# Patient Record
Sex: Female | Born: 1970 | Race: Black or African American | Hispanic: No | Marital: Married | Smoking: Current every day smoker
Health system: Southern US, Community
[De-identification: ages and names within clinical notes are randomized; demographics above are authoritative.]

## PROBLEM LIST (undated history)

## (undated) DIAGNOSIS — G629 Polyneuropathy, unspecified: Secondary | ICD-10-CM

## (undated) DIAGNOSIS — B20 Human immunodeficiency virus [HIV] disease: Secondary | ICD-10-CM

## (undated) HISTORY — DX: Polyneuropathy, unspecified: G62.9

## (undated) HISTORY — PX: APPENDECTOMY: SHX54

---

## 2015-09-19 ENCOUNTER — Emergency Department (HOSPITAL_COMMUNITY): Payer: Medicaid Other

## 2015-09-19 ENCOUNTER — Emergency Department (HOSPITAL_COMMUNITY)
Admission: EM | Admit: 2015-09-19 | Discharge: 2015-09-19 | Disposition: A | Discharge status: Home / Self Care | Payer: Medicaid Other | Attending: Emergency Medicine | Admitting: Emergency Medicine

## 2015-09-19 ENCOUNTER — Encounter (HOSPITAL_COMMUNITY): Payer: Self-pay | Admitting: *Deleted

## 2015-09-19 DIAGNOSIS — Z720 Tobacco use: Secondary | ICD-10-CM | POA: Insufficient documentation

## 2015-09-19 DIAGNOSIS — R531 Weakness: Secondary | ICD-10-CM | POA: Diagnosis present

## 2015-09-19 DIAGNOSIS — Z8673 Personal history of transient ischemic attack (TIA), and cerebral infarction without residual deficits: Secondary | ICD-10-CM | POA: Insufficient documentation

## 2015-09-19 DIAGNOSIS — B20 Human immunodeficiency virus [HIV] disease: Secondary | ICD-10-CM | POA: Diagnosis not present

## 2015-09-19 DIAGNOSIS — H538 Other visual disturbances: Secondary | ICD-10-CM | POA: Diagnosis not present

## 2015-09-19 HISTORY — DX: Human immunodeficiency virus (HIV) disease: B20

## 2015-09-19 LAB — DIFFERENTIAL
BASOS ABS: 0 10*3/uL (ref 0.0–0.1)
BASOS PCT: 1 %
EOS ABS: 0.8 10*3/uL — AB (ref 0.0–0.7)
EOS PCT: 24 %
LYMPHS ABS: 0.8 10*3/uL (ref 0.7–4.0)
Lymphocytes Relative: 26 %
Monocytes Absolute: 0.3 10*3/uL (ref 0.1–1.0)
Monocytes Relative: 8 %
NEUTROS PCT: 41 %
Neutro Abs: 1.3 10*3/uL — ABNORMAL LOW (ref 1.7–7.7)

## 2015-09-19 LAB — URINE MICROSCOPIC-ADD ON

## 2015-09-19 LAB — I-STAT CHEM 8, ED
BUN: 11 mg/dL (ref 6–20)
CALCIUM ION: 1.12 mmol/L (ref 1.12–1.23)
Chloride: 104 mmol/L (ref 101–111)
Creatinine, Ser: 0.6 mg/dL (ref 0.44–1.00)
Glucose, Bld: 89 mg/dL (ref 65–99)
HEMATOCRIT: 32 % — AB (ref 36.0–46.0)
HEMOGLOBIN: 10.9 g/dL — AB (ref 12.0–15.0)
Potassium: 4 mmol/L (ref 3.5–5.1)
SODIUM: 139 mmol/L (ref 135–145)
TCO2: 22 mmol/L (ref 0–100)

## 2015-09-19 LAB — URINALYSIS, ROUTINE W REFLEX MICROSCOPIC
BILIRUBIN URINE: NEGATIVE
Glucose, UA: NEGATIVE mg/dL
KETONES UR: NEGATIVE mg/dL
Leukocytes, UA: NEGATIVE
NITRITE: NEGATIVE
Protein, ur: NEGATIVE mg/dL
SPECIFIC GRAVITY, URINE: 1.012 (ref 1.005–1.030)
UROBILINOGEN UA: 0.2 mg/dL (ref 0.0–1.0)
pH: 7 (ref 5.0–8.0)

## 2015-09-19 LAB — COMPREHENSIVE METABOLIC PANEL
ALT: 6 U/L — ABNORMAL LOW (ref 14–54)
AST: 13 U/L — AB (ref 15–41)
Albumin: 3.8 g/dL (ref 3.5–5.0)
Alkaline Phosphatase: 82 U/L (ref 38–126)
Anion gap: 8 (ref 5–15)
BILIRUBIN TOTAL: 0.4 mg/dL (ref 0.3–1.2)
BUN: 9 mg/dL (ref 6–20)
CHLORIDE: 104 mmol/L (ref 101–111)
CO2: 21 mmol/L — ABNORMAL LOW (ref 22–32)
Calcium: 8.5 mg/dL — ABNORMAL LOW (ref 8.9–10.3)
Creatinine, Ser: 0.61 mg/dL (ref 0.44–1.00)
GFR calc Af Amer: >60 mL/min (ref >60)
Glucose, Bld: 88 mg/dL (ref 65–99)
POTASSIUM: 3.9 mmol/L (ref 3.5–5.1)
Sodium: 133 mmol/L — ABNORMAL LOW (ref 135–145)
TOTAL PROTEIN: 8 g/dL (ref 6.5–8.1)

## 2015-09-19 LAB — I-STAT TROPONIN, ED: TROPONIN I, POC: 0 ng/mL (ref 0.00–0.08)

## 2015-09-19 LAB — APTT: APTT: 32 s (ref 24–37)

## 2015-09-19 LAB — CBC
HCT: 29.5 % — ABNORMAL LOW (ref 36.0–46.0)
HEMOGLOBIN: 9.3 g/dL — AB (ref 12.0–15.0)
MCH: 25.5 pg — AB (ref 26.0–34.0)
MCHC: 31.5 g/dL (ref 30.0–36.0)
MCV: 81 fL (ref 78.0–100.0)
Platelets: 193 10*3/uL (ref 150–400)
RBC: 3.64 MIL/uL — ABNORMAL LOW (ref 3.87–5.11)
RDW: 15.4 % (ref 11.5–15.5)
WBC: 3.1 10*3/uL — ABNORMAL LOW (ref 4.0–10.5)

## 2015-09-19 LAB — CBG MONITORING, ED: GLUCOSE-CAPILLARY: 75 mg/dL (ref 65–99)

## 2015-09-19 LAB — PROTIME-INR
INR: 1.17 (ref 0.00–1.49)
Prothrombin Time: 15.1 seconds (ref 11.6–15.2)

## 2015-09-19 NOTE — ED Notes (Signed)
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  Pt states CD4 Count low.  Pt states got dizzy yesterday and both legs gave out.  Pt denies cough, diarrhea, vomiting.  Pt is alert and reports intermittent left eye vision loss.  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

## 2015-09-19 NOTE — ED Notes (Signed)
Pt and family at bedside given happy meals and sprite to drink. nad noted.

## 2015-09-19 NOTE — ED Notes (Signed)
Pt reports "I feel like I have something crawling on me and biting me and im itchy." no bugs seen on pt and no bite marks noted to skin. Pt requesting rx cream to apply to arms and legs, PA Pickering notified and made aware. PA recommends hydrocortisone cream, pt informed of this. Pt upset at this time and requesting to speak with PA, states "Ive tried hydrocortisone cream and it doesn't work."

## 2015-09-19 NOTE — Discharge Instructions (Signed)
AIDS AIDS (Acquired Immune Deficiency Syndrome) is a severe viral infection caused by the Human Immunodeficiency Virus (HIV). This virus destroys a person's resistance to disease and certain cancers. It is transmitted through blood, blood products, and body fluids. Although the fear of AIDS has grown faster than the epidemic, it is important to note that AIDS is not spread by casual contact and is easily killed by hot water, soap, bleach, and most antiseptics. HOW THE AIDS INFECTION WORKS Once HIV enters the body it affects T-helper (T-4) lymphocytes. These are white blood cells that are crucial for the immune system. Once they become infected, they become factories for producing the AIDS Virus. Eventually these infected T-4 cells die. This leaves the victim susceptible to infection and certain cancers. While anyone can get AIDS, you are unlikely to get this disease unless you indulge in high-risk behavior. Some of these high-risk behaviors are promiscuous sex, having close relationships with HIV-positive people, and the sharing of needles. This illness was initially more common in homosexual men, but as time progresses, it will most probably affect an equal number of men and women. Babies born to women who are infected, have greater chances of getting AIDS. Infection with HIV may cause a brief, mild illness with fever several weeks after contact. More serious symptoms do not develop until months or years later, so a person can be infected with the virus without showing any symptoms at all. At this stage of the infection there is no way of knowing you are infected unless you have a blood or mouth scraping test for the AIDS virus. SYMPTOMS   Fevers, night sweats, general weakness, enlarged lymph nodes.  Chest pain, pneumonia, chronic cough, shortness of breath.  Weight loss, diarrhea, difficulty swallowing, rectal problems.  Headaches, personality changes, problems with vision and memory.  Skin tumors  (patchy dark areas) and infections. There is no cure or vaccine for AIDS at the present time. Anti-viral antibiotic drugs have been shown to stop the virus from multiplying, which helps prolong health. The best treatment against AIDS is prevention. If you have been infected with HIV, careful medical follow-up with regular blood tests is necessary.  Do not take part in risky behaviors. These include sharing needles and syringes or other sharp instruments like razors with others, having unprotected sex with high-risk people (homosexuals, bisexuals, or prostitutes), and engaging in anal sex (with or without a condom).  For further information about AIDS, please call your caregiver, the health department, or the Center for Disease Control: 800-342-AIDS. MISCONCEPTIONS ABOUT AIDS  You can not get AIDS through casual contact. This includes sitting next to an AIDS infected person, being coughed on, living with, swimming with, eating food prepared by, sitting or lying next to someone with AIDS.  It is not caught from toilet seats, from showers, bath tubs, water fountains, phones, drinking glasses, or food touched or used by people with AIDS. Casual kissing will probably not transmit the disease. "[place] kissing" (putting one's tongue in another's mouth) is probably not a good idea as the AIDS Virus is present in saliva.  You will not get AIDS by donating blood. The needles used by blood banks are sterile and disposable.  There is no evidence that AIDS is transmitted through tears.  AIDS is not caught through mosquitoes.  Children with AIDS will not pass AIDS to other children in school without exchange of blood products or engaging in sex. In school, a child with AIDS is actually at greater risk because  of their weak immune status. Their susceptibility to the viruses and germs (bacteria) carried by children without AIDS is great. TESTING FOR AIDS  An ELISA (enzyme-linked immunoabsorbent assay) is a blood  test available to let you know if you have contracted AIDS. If this test is positive, it is usually repeated.  If positive a second time, a second test known as the Western blot test is performed. If the Western blot test is positive, it means you have been infected with HIV. PREVENTION  The best way to prevent AIDS is to avoid high-risk behavior.  The outlook for defeating AIDS is good. Millions of research dollars are being spent on creating a vaccine to prevent the disease as well as providing a cure. New drugs appear to be extremely effective at controlling the disease.  Your caregiver will educate you in all the most effective and current treatments. Document Released: 11/29/2000 Document Revised: 02/24/2012 Document Reviewed: 11/25/[address] [facility] Patient Information 2015 [place], [place]. This information is not intended to replace advice given to you by your health care provider. Make sure you discuss any questions you have with your health care provider.

## 2015-09-19 NOTE — ED Notes (Signed)
Patient transported to CT 

## 2015-09-19 NOTE — ED Provider Notes (Signed)
CSN: [id number]     Arrival date & time 09/19/15  1328 History   First MD Initiated Contact with Patient 09/19/15 1508     No chief complaint on file.    (Consider location/radiation/quality/duration/timing/severity/associated sxs/prior Treatment) HPI Comments: Pt comes in with c/o bilateral leg weakness for the last couple of days. Pt states that she was standing and she fell. She denies one sided weakness. She states that she had some right sided weakness from a previous stroke.She states that she has been having vision changes to the left eye for the last couple of months and she was referred to opthalmology but because of moving she never follow up. She was told that her blood vessels were black in her eye. She states that she didn't have a loc. She has not taken her AIDS medications in 4 months and she wants to get set up with ID here. No fevers.   The history is provided by the patient. No language interpreter was used.    Past Medical History  Diagnosis Date  . AIDS (acquired immune deficiency syndrome) [facility])    Past Surgical History  Procedure Laterality Date  . Appendectomy     No family history on file. Social History  Substance Use Topics  . Smoking status: Current Every Day Smoker  . Smokeless tobacco: None  . Alcohol Use: No   OB History    No data available     Review of Systems  All other systems reviewed and are negative.     Allergies  Review of patient's allergies indicates no known allergies.  Home Medications   Prior to Admission medications   Not on File   BP 133/89 mmHg  Pulse 65  Temp(Src) 98.2 F (36.8 C) (Oral)  Resp 18  SpO2 100%  LMP 09/18/2015 Physical Exam  Constitutional: She is oriented to person, place, and time. She appears well-developed and well-nourished.  HENT:  Head: Normocephalic and atraumatic.  Cardiovascular: Normal rate and regular rhythm.   Pulmonary/Chest: Effort normal and breath sounds normal.  Abdominal: Soft.  Bowel sounds are normal.  Neurological: She is alert and oriented to person, place, and time. She exhibits normal muscle tone. Coordination normal.  No noted weakness. No pronator drip  Skin: Skin is warm and dry.  Psychiatric: She has a normal mood and affect.  Nursing note and vitals reviewed.   ED Course  Procedures (including critical care time) Labs Review Labs Reviewed  CBC - Abnormal; Notable for the following:    WBC 3.1 (*)    RBC 3.64 (*)    Hemoglobin 9.3 (*)    HCT 29.5 (*)    MCH 25.5 (*)    All other components within normal limits  DIFFERENTIAL - Abnormal; Notable for the following:    Neutro Abs 1.3 (*)    Eosinophils Absolute 0.8 (*)    All other components within normal limits  COMPREHENSIVE METABOLIC PANEL - Abnormal; Notable for the following:    Sodium 133 (*)    CO2 21 (*)    Calcium 8.5 (*)    AST 13 (*)    ALT 6 (*)    All other components within normal limits  URINALYSIS, ROUTINE W REFLEX MICROSCOPIC (NOT AT [facility]) - Abnormal; Notable for the following:    Hgb urine dipstick LARGE (*)    All other components within normal limits  I-STAT CHEM 8, ED - Abnormal; Notable for the following:    Hemoglobin 10.9 (*)  HCT 32.0 (*)    All other components within normal limits  PROTIME-INR  APTT  URINE MICROSCOPIC-ADD ON  I-STAT TROPOININ, ED  CBG MONITORING, ED    Imaging Review Ct Head Wo Contrast  09/19/2015   CLINICAL DATA:  Left-sided headache. Left-sided vision loss. HIV disease. History of prior infarct.  EXAM: CT HEAD WITHOUT CONTRAST  TECHNIQUE: Contiguous axial images were obtained from the base of the skull through the vertex without intravenous contrast.  COMPARISON:  None.  FINDINGS: The ventricles are normal in size and configuration. There is no intracranial mass, hemorrhage, extra-axial fluid collection, or midline shift. There is evidence of a prior infarct in the posterior mid left frontal lobe. There is evidence of age uncertain but  probably nonacute infarction in the anterior to mid left centrum semiovale. There is a small focus of decreased attenuation in the posterior superior left cerebellum seen on axial slice 10 series 201, consistent with a small age uncertain infarct. Elsewhere the gray-white compartments appear normal. The bony calvarium appears intact. The mastoid air cells are clear. There is rightward deviation of the nasal septum. No intraorbital lesions are demonstrable.  IMPRESSION: Age uncertain but probably nonacute appearing infarcts in the anterior to mid left centrum semiovale as well as in the posterior mid left frontal lobe region. Age uncertain small infarct posterior aspect of the superior left frontal lobe seen on axial slice 10 series 601. No clearly acute infarct is evident. No hemorrhage or mass effect.  If there remains concern for potential acute infarct, brain MR could be helpful to further assess in this regard.   Electronically Signed   By: Bretta Bang III M.D.   On: 09/19/2015 15:58   I have personally reviewed and evaluated these images and lab results as part of my medical decision-making.   EKG Interpretation None      MDM   Final diagnoses:  AIDS (acquired immunodeficiency syndrome) (HCC)    Spoke with Dr. Drue Second with ID and pt is to get a call from the clinic to set up care. Don't think ct image is acute based on pt presentation. Pt in agreement with plan.  Teressa Lower, NP 09/19/15 1710  Mirian Mo, MD 09/21/15 1531

## 2015-09-27 ENCOUNTER — Emergency Department (HOSPITAL_COMMUNITY): Payer: Medicaid Other

## 2015-09-27 ENCOUNTER — Encounter (HOSPITAL_COMMUNITY): Payer: Self-pay | Admitting: Emergency Medicine

## 2015-09-27 ENCOUNTER — Emergency Department (HOSPITAL_COMMUNITY)
Admission: EM | Admit: 2015-09-27 | Discharge: 2015-09-27 | Disposition: A | Discharge status: Home / Self Care | Payer: Medicaid Other | Attending: Emergency Medicine | Admitting: Emergency Medicine

## 2015-09-27 DIAGNOSIS — Z720 Tobacco use: Secondary | ICD-10-CM | POA: Insufficient documentation

## 2015-09-27 DIAGNOSIS — R531 Weakness: Secondary | ICD-10-CM | POA: Insufficient documentation

## 2015-09-27 DIAGNOSIS — Z8673 Personal history of transient ischemic attack (TIA), and cerebral infarction without residual deficits: Secondary | ICD-10-CM | POA: Diagnosis not present

## 2015-09-27 DIAGNOSIS — Z7951 Long term (current) use of inhaled steroids: Secondary | ICD-10-CM | POA: Diagnosis not present

## 2015-09-27 DIAGNOSIS — B20 Human immunodeficiency virus [HIV] disease: Secondary | ICD-10-CM | POA: Diagnosis not present

## 2015-09-27 LAB — COMPREHENSIVE METABOLIC PANEL
ALT: 7 U/L — ABNORMAL LOW (ref 14–54)
ANION GAP: 7 (ref 5–15)
AST: 14 U/L — ABNORMAL LOW (ref 15–41)
Albumin: 3.4 g/dL — ABNORMAL LOW (ref 3.5–5.0)
Alkaline Phosphatase: 76 U/L (ref 38–126)
BILIRUBIN TOTAL: 0.5 mg/dL (ref 0.3–1.2)
BUN: 9 mg/dL (ref 6–20)
CALCIUM: 9 mg/dL (ref 8.9–10.3)
CO2: 26 mmol/L (ref 22–32)
Chloride: 104 mmol/L (ref 101–111)
Creatinine, Ser: 0.7 mg/dL (ref 0.44–1.00)
GLUCOSE: 84 mg/dL (ref 65–99)
POTASSIUM: 3.6 mmol/L (ref 3.5–5.1)
Sodium: 137 mmol/L (ref 135–145)
TOTAL PROTEIN: 8.8 g/dL — AB (ref 6.5–8.1)

## 2015-09-27 LAB — CBC WITH DIFFERENTIAL/PLATELET
Basophils Absolute: 0 10*3/uL (ref 0.0–0.1)
Basophils Relative: 1 %
Eosinophils Absolute: 0.5 10*3/uL (ref 0.0–0.7)
Eosinophils Relative: 18 %
HEMATOCRIT: 29.1 % — AB (ref 36.0–46.0)
Hemoglobin: 9.5 g/dL — ABNORMAL LOW (ref 12.0–15.0)
LYMPHS PCT: 28 %
Lymphs Abs: 0.9 10*3/uL (ref 0.7–4.0)
MCH: 26.2 pg (ref 26.0–34.0)
MCHC: 32.6 g/dL (ref 30.0–36.0)
MCV: 80.4 fL (ref 78.0–100.0)
MONO ABS: 0.4 10*3/uL (ref 0.1–1.0)
MONOS PCT: 14 %
NEUTROS ABS: 1.2 10*3/uL — AB (ref 1.7–7.7)
Neutrophils Relative %: 39 %
Platelets: 185 10*3/uL (ref 150–400)
RBC: 3.62 MIL/uL — ABNORMAL LOW (ref 3.87–5.11)
RDW: 15 % (ref 11.5–15.5)
WBC: 3 10*3/uL — ABNORMAL LOW (ref 4.0–10.5)

## 2015-09-27 MED ORDER — SODIUM CHLORIDE 0.9 % IV SOLN
INTRAVENOUS | Status: DC
Start: 2015-09-27 — End: 2015-09-28
  Administered 2015-09-27: 16:00:00 via INTRAVENOUS

## 2015-09-27 MED ORDER — MORPHINE SULFATE (PF) 4 MG/ML IV SOLN
4.0000 mg | Freq: Once | INTRAVENOUS | Status: AC
  Administered 2015-09-27: 4 mg via INTRAVENOUS
  Filled 2015-09-27: qty 1

## 2015-09-27 MED ORDER — GADOBENATE DIMEGLUMINE 529 MG/ML IV SOLN
12.0000 mL | Freq: Once | INTRAVENOUS | Status: AC | PRN
  Administered 2015-09-27: 12 mL via INTRAVENOUS

## 2015-09-27 MED ORDER — METHOCARBAMOL 750 MG PO TABS: 750.0000 mg | ORAL_TABLET | Freq: Four times a day (QID) | ORAL | Status: DC

## 2015-09-27 MED ORDER — OXYCODONE-ACETAMINOPHEN 5-325 MG PO TABS: 1.0000 | ORAL_TABLET | ORAL | Status: DC | PRN

## 2015-09-27 NOTE — ED Provider Notes (Signed)
CSN: [id number]     Arrival date & time 09/27/15  1220 History   First MD Initiated Contact with Patient 09/27/15 1528     Chief Complaint  Patient presents with  . Extremity Weakness  . Back Pain     (Consider location/radiation/quality/duration/timing/severity/associated sxs/prior Treatment) HPI Comments: Patient here complaining of worsening bilateral lower extremity weakness 1 month. Patient able to ambulate but with some difficulty. Seen 1 week ago for similar symptoms and had a head CT which showed strokes of indeterminate age. Denies any bowel or bladder dysfunction. States that she also has pain that starts in her lateral lower extremities and radiates to her back. Denies any urinary symptoms. States that her legs sometimes give out on her and she has fall to the ground. Denies any significant head trauma. She has a known diagnosis of HIV and according to her has full-blown AIDS but is not taking any medications. Denies any associated fevers or shortness of breath.  Patient is a 44 y.o. female presenting with extremity weakness and back pain. The history is provided by the patient.  Extremity Weakness  Back Pain   Past Medical History  Diagnosis Date  . AIDS (acquired immune deficiency syndrome) [facility])    Past Surgical History  Procedure Laterality Date  . Appendectomy     No family history on file. Social History  Substance Use Topics  . Smoking status: Current Every Day Smoker  . Smokeless tobacco: None  . Alcohol Use: No   OB History    No data available     Review of Systems  Musculoskeletal: Positive for back pain and extremity weakness.  All other systems reviewed and are negative.     Allergies  Review of patient's allergies indicates no known allergies.  Home Medications   Prior to Admission medications   Medication Sig Start Date End Date Taking? Authorizing Provider  albuterol (PROVENTIL HFA;VENTOLIN HFA) 108 (90 BASE) MCG/ACT inhaler Inhale 1  puff into the lungs every 6 (six) hours as needed for wheezing or shortness of breath.    Historical Provider, MD  beclomethasone (QVAR) 40 MCG/ACT inhaler Inhale 1 puff into the lungs 2 (two) times daily.    Historical Provider, MD   BP 120/77 mmHg  Pulse 74  Temp(Src) 97.9 F (36.6 C) (Oral)  Resp 16  Ht  (1.676 m)  Wt 130 lb (58.968 kg)  BMI 20.99 kg/m2  SpO2 100%  LMP 09/18/2015 Physical Exam  Constitutional: She is oriented to person, place, and time. She appears well-developed and well-nourished.  Non-toxic appearance. No distress.  HENT:  Head: Normocephalic and atraumatic.  Eyes: Conjunctivae, EOM and lids are normal. Pupils are equal, round, and reactive to light.  Neck: Normal range of motion. Neck supple. No tracheal deviation present. No thyroid mass present.  Cardiovascular: Normal rate, regular rhythm and normal heart sounds.  Exam reveals no gallop.   No murmur heard. Pulmonary/Chest: Effort normal and breath sounds normal. No stridor. No respiratory distress. She has no decreased breath sounds. She has no wheezes. She has no rhonchi. She has no rales.  Abdominal: Soft. Normal appearance and bowel sounds are normal. She exhibits no distension. There is no tenderness. There is no rebound and no CVA tenderness.  Musculoskeletal: Normal range of motion. She exhibits no edema or tenderness.  Neurological: She is alert and oriented to person, place, and time. She has normal strength. She displays no atrophy. No cranial nerve deficit or sensory deficit. She exhibits normal muscle  tone. GCS eye subscore is 4. GCS verbal subscore is 5. GCS motor subscore is 6.  Reflex Scores:      Patellar reflexes are 3+ on the right side and 3+ on the left side. Skin: Skin is warm and dry. No abrasion and no rash noted.  Psychiatric: She has a normal mood and affect. Her speech is normal and behavior is normal.  Nursing note and vitals reviewed.   ED Course  Procedures (including  critical care time) Labs Review Labs Reviewed  CBC WITH DIFFERENTIAL/PLATELET  COMPREHENSIVE METABOLIC PANEL    Imaging Review No results found. I have personally reviewed and evaluated these images and lab results as part of my medical decision-making.   EKG Interpretation None      MDM   Final diagnoses:  Weakness    Patient given pain meds and feels better here. Her MRI of her brain as well as lumbar spine were negative for acute findings. Neurological exam she has no gross focal deficits. She is encouraged to follow-up in ID clinic. 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

## 2015-09-27 NOTE — ED Notes (Signed)
Onset 5-6 days ago lower back pain radiating to bilateral lower extremities with tingling numbness pain. Currently pain 10/10 sharp.

## 2015-09-27 NOTE — Discharge Instructions (Signed)
Weakness Weakness is a lack of strength. It may be felt all over the body (generalized) or in one specific part of the body (focal). Some causes of weakness can be serious. You may need further medical evaluation, especially if you are elderly or you have a history of immunosuppression (such as chemotherapy or HIV), kidney disease, heart disease, or diabetes. CAUSES  Weakness can be caused by many different things, including:  Infection.  Physical exhaustion.  Internal bleeding or other blood loss that results in a lack of red blood cells (anemia).  Dehydration. This cause is more common in elderly people.  Side effects or electrolyte abnormalities from medicines, such as pain medicines or sedatives.  Emotional distress, anxiety, or depression.  Circulation problems, especially severe peripheral arterial disease.  Heart disease, such as rapid atrial fibrillation, bradycardia, or heart failure.  Nervous system disorders, such as Guillain-Barr syndrome, multiple sclerosis, or stroke. DIAGNOSIS  To find the cause of your weakness, your caregiver will take your history and perform a physical exam. Lab tests or X-rays may also be ordered, if needed. TREATMENT  Treatment of weakness depends on the cause of your symptoms and can vary greatly. HOME CARE INSTRUCTIONS   Rest as needed.  Eat a well-balanced diet.  Try to get some exercise every day.  Only take over-the-counter or prescription medicines as directed by your caregiver. SEEK MEDICAL CARE IF:   Your weakness seems to be getting worse or spreads to other parts of your body.  You develop new aches or pains. SEEK IMMEDIATE MEDICAL CARE IF:   You cannot perform your normal daily activities, such as getting dressed and feeding yourself.  You cannot walk up and down stairs, or you feel exhausted when you do so.  You have shortness of breath or chest pain.  You have difficulty moving parts of your body.  You have weakness  in only one area of the body or on only one side of the body.  You have a fever.  You have trouble speaking or swallowing.  You cannot control your bladder or bowel movements.  You have black or bloody vomit or stools. MAKE SURE YOU:  Understand these instructions.  Will watch your condition.  Will get help right away if you are not doing well or get worse.   This information is not intended to replace advice given to you by your health care provider. Make sure you discuss any questions you have with your health care provider.   Document Released: 12/02/2005 Document Revised: 06/02/2012 Document Reviewed: 01/31/2012 Elsevier Interactive Patient Education 2016 [employer] Neuropathic Pain Neuropathic pain is pain caused by damage to the nerves that are responsible for certain sensations in your body (sensory nerves). The pain can be caused by damage to:   The sensory nerves that send signals to your spinal cord and brain (peripheral nervous system).  The sensory nerves in your brain or spinal cord (central nervous system). Neuropathic pain can make you more sensitive to pain. What would be a minor sensation for most people may feel very painful if you have neuropathic pain. This is usually a long-term condition that can be difficult to treat. The type of pain can differ from person to person. It may start suddenly (acute), or it may develop slowly and last for a long time (chronic). Neuropathic pain may come and go as damaged nerves heal or may stay at the same level for years. It often causes emotional distress, loss of sleep, and a  lower quality of life. CAUSES  The most common cause of damage to a sensory nerve is diabetes. Many other diseases and conditions can also cause neuropathic pain. Causes of neuropathic pain can be classified as:  Toxic. Many drugs and chemicals can cause toxic damage. The most common cause of toxic neuropathic pain is damage from drug treatment for  cancer (chemotherapy).  Metabolic. This type of pain can happen when a disease causes imbalances that damage nerves. Diabetes is the most common of these diseases. Vitamin B deficiency caused by long-term alcohol abuse is another common cause.  Traumatic. Any injury that cuts, crushes, or stretches a nerve can cause damage and pain. A common example is feeling pain after losing an arm or leg (phantom limb pain).  Compression-related. If a sensory nerve gets trapped or compressed for a long period of time, the blood supply to the nerve can be cut off.  Vascular. Many blood vessel diseases can cause neuropathic pain by decreasing blood supply and oxygen to nerves.  Autoimmune. This type of pain results from diseases in which the body's defense system mistakenly attacks sensory nerves. Examples of autoimmune diseases that can cause neuropathic pain include lupus and multiple sclerosis.  Infectious. Many types of viral infections can damage sensory nerves and cause pain. Shingles infection is a common cause of this type of pain.  Inherited. Neuropathic pain can be a symptom of many diseases that are passed down through families (genetic). SIGNS AND SYMPTOMS  The main symptom is pain. Neuropathic pain is often described as:  Burning.  Shock-like.  Stinging.  Hot or cold.  Itching. DIAGNOSIS  No single test can diagnose neuropathic pain. Your health care provider will do a physical exam and ask you about your pain. You may use a pain scale to describe how bad your pain is. You may also have tests to see if you have a high sensitivity to pain and to help find the cause and location of any sensory nerve damage. These tests may include:  Imaging studies, such as:  X-rays.  CT scan.  MRI.  Nerve conduction studies to test how well nerve signals travel through your sensory nerves (electrodiagnostic testing).  Stimulating your sensory nerves through electrodes on your skin and measuring  the response in your spinal cord and brain (somatosensory evoked potentials). TREATMENT  Treatment for neuropathic pain may change over time. You may need to try different treatment options or a combination of treatments. Some options include:  Over-the-counter pain relievers.  Prescription medicines. Some medicines used to treat other conditions may also help neuropathic pain. These include medicines to:  Control seizures (anticonvulsants).  Relieve depression (antidepressants).  Prescription-strength pain relievers (narcotics). These are usually used when other pain relievers do not help.  Transcutaneous nerve stimulation (TENS). This uses electrical currents to block painful nerve signals. The treatment is painless.  Topical and local anesthetics. These are medicines that numb the nerves. They can be injected as a nerve block or applied to the skin.  Alternative treatments, such as:  Acupuncture.  Meditation.  Massage.  Physical therapy.  Pain management programs.  Counseling. HOME CARE INSTRUCTIONS  Learn as much as you can about your condition.  Take medicines only as directed by your health care provider.  Work closely with all your health care providers to find what works best for you.  Have a good support system at home.  Consider joining a chronic pain support group. SEEK MEDICAL CARE IF:  Your pain treatments are  not helping.  You are having side effects from your medicines.  You are struggling with fatigue, mood changes, depression, or anxiety.   This information is not intended to replace advice given to you by your health care provider. Make sure you discuss any questions you have with your health care provider.   Document Released: 08/29/2004 Document Revised: 12/23/2014 Document Reviewed: 05/12/2014 Elsevier Interactive Patient Education [employer]

## 2015-09-27 NOTE — ED Notes (Signed)
Pt out of room to MRI at this time.

## 2015-10-06 ENCOUNTER — Encounter (HOSPITAL_BASED_OUTPATIENT_CLINIC_OR_DEPARTMENT_OTHER): Payer: Medicaid Other | Admitting: Clinical

## 2015-10-06 ENCOUNTER — Telehealth: Payer: Self-pay | Admitting: Physician Assistant

## 2015-10-06 ENCOUNTER — Other Ambulatory Visit: Payer: Self-pay | Admitting: Physician Assistant

## 2015-10-06 ENCOUNTER — Ambulatory Visit: Payer: Medicaid Other | Attending: Physician Assistant | Admitting: Physician Assistant

## 2015-10-06 VITALS — BP 114/79 | HR 90 | Temp 98.2°F | Resp 18 | Ht 65.0 in | Wt 129.4 lb

## 2015-10-06 DIAGNOSIS — Z658 Other specified problems related to psychosocial circumstances: Secondary | ICD-10-CM

## 2015-10-06 DIAGNOSIS — M549 Dorsalgia, unspecified: Secondary | ICD-10-CM | POA: Diagnosis present

## 2015-10-06 DIAGNOSIS — Z79899 Other long term (current) drug therapy: Secondary | ICD-10-CM | POA: Insufficient documentation

## 2015-10-06 DIAGNOSIS — Z720 Tobacco use: Secondary | ICD-10-CM | POA: Insufficient documentation

## 2015-10-06 DIAGNOSIS — B20 Human immunodeficiency virus [HIV] disease: Secondary | ICD-10-CM | POA: Insufficient documentation

## 2015-10-06 DIAGNOSIS — G629 Polyneuropathy, unspecified: Secondary | ICD-10-CM | POA: Insufficient documentation

## 2015-10-06 DIAGNOSIS — G909 Disorder of the autonomic nervous system, unspecified: Secondary | ICD-10-CM | POA: Diagnosis not present

## 2015-10-06 DIAGNOSIS — F17200 Nicotine dependence, unspecified, uncomplicated: Secondary | ICD-10-CM | POA: Diagnosis not present

## 2015-10-06 LAB — VITAMIN B12: Vitamin B-12: 445 pg/mL (ref 211–911)

## 2015-10-06 LAB — TSH: TSH: 0.573 u[IU]/mL (ref 0.350–4.500)

## 2015-10-06 MED ORDER — ELVITEG-COBIC-EMTRICIT-TENOFDF 150-150-200-300 MG PO TABS: 1.0000 | ORAL_TABLET | Freq: Once | ORAL | Status: DC

## 2015-10-06 MED ORDER — SILDENAFIL CITRATE 20 MG PO TABS: 20.0000 mg | ORAL_TABLET | Freq: Three times a day (TID) | ORAL | Status: AC

## 2015-10-06 MED ORDER — PERMETHRIN 1 % EX LOTN: 1.0000 "application " | TOPICAL_LOTION | Freq: Once | CUTANEOUS | Status: DC

## 2015-10-06 MED ORDER — METHOCARBAMOL 750 MG PO TABS: 750.0000 mg | ORAL_TABLET | Freq: Four times a day (QID) | ORAL | Status: AC

## 2015-10-06 MED ORDER — BECLOMETHASONE DIPROPIONATE 40 MCG/ACT IN AERS: 1.0000 | INHALATION_SPRAY | Freq: Two times a day (BID) | RESPIRATORY_TRACT | Status: AC

## 2015-10-06 MED ORDER — ALBUTEROL SULFATE HFA 108 (90 BASE) MCG/ACT IN AERS: 1.0000 | INHALATION_SPRAY | Freq: Four times a day (QID) | RESPIRATORY_TRACT | Status: AC | PRN

## 2015-10-06 MED ORDER — OMEPRAZOLE 40 MG PO CPDR: 40.0000 mg | DELAYED_RELEASE_CAPSULE | Freq: Every day | ORAL | Status: DC

## 2015-10-06 MED ORDER — QUETIAPINE FUMARATE 50 MG PO TABS: 50.0000 mg | ORAL_TABLET | Freq: Every morning | ORAL | Status: AC

## 2015-10-06 MED ORDER — ALBUTEROL SULFATE HFA 108 (90 BASE) MCG/ACT IN AERS: 1.0000 | INHALATION_SPRAY | Freq: Four times a day (QID) | RESPIRATORY_TRACT | Status: DC | PRN

## 2015-10-06 MED ORDER — QUETIAPINE FUMARATE 100 MG PO TABS: 100.0000 mg | ORAL_TABLET | Freq: Every day | ORAL | Status: AC

## 2015-10-06 MED ORDER — PERMETHRIN 5 % EX CREA: 1.0000 "application " | TOPICAL_CREAM | Freq: Once | CUTANEOUS | Status: DC

## 2015-10-06 NOTE — Progress Notes (Addendum)
Meagan Mcclure  ZOX:[id number]  [id number]:[id number]  DOB - [birth year]  Chief Complaint  Patient presents with  . Hospitalization Follow-up  . Back Pain       Subjective:   Meagan Mcclure is a 44 y.o. female here today for tablet care. She was in the emergency department on October 12th with a chief complaint of back pain and lower extremity pain. Her imaging studies were negative. Her labs were only significant for anemia. She was discharged on Robaxin and Percocet. She was also instructed that she needed to establish care with an infectious disease specialist and a primary care provider.  She's been taking the medications. The Robaxin seems to help a little. The Percocet has not touched her pain. She describes pain that starts in her feet and moves up to her body. She has a central back discomfort that spreads outwards. She has difficulty with range of motion. She is able to walk a little. She's very uncomfortable week.  Further history reveals that she is only been in this area approximately 6 weeks. [place] She was diagnosed with AIDS sometime ago and has been off her antiretrovirals since at least April. She now has Medicaid.   ROS: GEN: denies fever or chills, denies change in weight Skin: denies lesions or rashes HEENT: denies headache, earache, epistaxis, sore throat, or neck pain EXT: + muscle spasms or swelling; no pain in lower ext, no weakness NEURO: + numbness or tingling, denies sz, stroke or TIA  Problem  Aids (Hcc)  HIV-1 Associated Autonomic Neuropathy (Hcc)  Tobacco Use Disorder    ALLERGIES: No Known Allergies  PAST MEDICAL HISTORY: Past Medical History  Diagnosis Date  . AIDS (acquired immune deficiency syndrome) (HCC)     PAST SURGICAL HISTORY: Past Surgical History  Procedure Laterality Date  . Appendectomy      MEDICATIONS AT HOME: Prior to Admission medications   Medication Sig Start Date End  Date Taking? Authorizing Provider  albuterol (PROVENTIL HFA;VENTOLIN HFA) 108 (90 BASE) MCG/ACT inhaler Inhale 1 puff into the lungs every 6 (six) hours as needed for wheezing or shortness of breath.   Yes Historical Provider, MD  beclomethasone (QVAR) 40 MCG/ACT inhaler Inhale 1 puff into the lungs 2 (two) times daily.   Yes Historical Provider, MD  elvitegravir-cobicistat-emtricitabine-tenofovir (STRIBILD) 150-150-200-300 MG TABS tablet Take 1 tablet by mouth once.   Yes Historical Provider, MD  methocarbamol (ROBAXIN-750) 750 MG tablet Take 1 tablet (750 mg total) by mouth 4 (four) times daily. 09/27/15  Yes Lorre NickAnthony Allen, MD  omeprazole (PRILOSEC) 40 MG capsule Take 40 mg by mouth daily.   Yes Historical Provider, MD  QUEtiapine (SEROQUEL) 100 MG tablet Take 100 mg by mouth at bedtime.   Yes Historical Provider, MD  QUEtiapine (SEROQUEL) 50 MG tablet Take 50 mg by mouth every morning.   Yes Historical Provider, MD  sildenafil (REVATIO) 20 MG tablet Take 20 mg by mouth 3 (three) times daily.   Yes Historical Provider, MD  oxyCODONE-acetaminophen (PERCOCET/ROXICET) 5-325 MG tablet Take 1-2 tablets by mouth every 4 (four) hours as needed for severe pain. Patient not taking: Reported on 10/06/2015 09/27/15   Lorre NickAnthony Allen, MD     Objective:   Filed Vitals:   10/06/15 0914  BP: 114/79  Pulse: 90  Temp: 98.2 F (36.8 C)  TempSrc: Oral  Resp: 18  Height: 5\' 5"  (1.651 m)  Weight: 129 lb 6.4 oz (58.695 kg)  SpO2: 99%  Exam General appearance : Awake, alert, not in any distress. Speech Clear. Not toxic looking HEENT: Atraumatic and Normocephalic, pupils equally reactive to light and accomodation Abdomen: Bowel sounds present, Non tender and not distended with no gaurding, rigidity or rebound. Extremities: B/L Lower Ext shows no edema, both legs are warm to touch good pulses and sensation; reflexes  Neurology: Awake alert, and oriented X 3, CN II-XII intact, Non focal; decreased  ROM   Assessment & Plan  1. HIV related Neuropathy  -check B12, TSH  -Discussed Gabapentin, Lyrica etc but pt has tried without success in the past  -Cont NSAIDS, muscle relaxers, Percocet for now  2. AIDS  -working on obtaining ART/ discussed with [facility] and SW   -referral to [facility] (will let them order viral load, CD4 etc)  -Attempt to get records from [place]  3. Tobacco Abuse  -smoking cessation  44 minutes was spent with her, the pharmacists, SW etc trying to coordinate medications and utilize other resources.   Return in about 2 weeks (around 10/20/2015) for with Dr. Armen Pickup to establish care.  The patient was given clear instructions to go to ER or return to medical center if symptoms don't improve, worsen or new problems develop. The patient verbalized understanding. The patient was told to call to get lab results if they haven't heard anything in the next week.   This note has been created with Education officer, environmental. Any transcriptional errors are unintentional.    Scot Jun, PA-C [facility] [place], [place] [phone]   10/06/2015, 10:00 AM

## 2015-10-06 NOTE — Telephone Encounter (Signed)
Pt. Called stating that the prescription of permethrin (PERMETHRIN LICE TREATMENT) 1 % lotion  Is not available at [facility] they only [facility] the cream and [facility] needs [facility] cream. Pt. Is at the pharmacy.Please f/u

## 2015-10-06 NOTE — Progress Notes (Signed)
Patient having pain and weakness in lower extremities  x3 weeks.  Patient indicates weakness radiating from legs into spine. Pain currently 7/10. Prescribed Percocet but not working per patient.

## 2015-10-06 NOTE — Addendum Note (Signed)
Addended byVivianne Master: NOEL, TIFFANY S on: 10/06/2015 10:32 AM   Modules accepted: Orders

## 2015-10-06 NOTE — Patient Instructions (Signed)
Stop Smoking!  [facility] [id number]  [phone]  Establish with a doctor here in 2 weeks. Schedule an appt today. (Dr. Armen PickupFunches)

## 2015-10-06 NOTE — Progress Notes (Signed)
ASSESSMENT: Pt currently experiencing psychosocial stressors, needs to establish PCP care, and [facility], along with supportive counseling and community resources regarding coping with psychosocial stressors.  Stage of Change: contemplative  PLAN: 1. F/U with behavioral health consultant in at next PCP visit 2. Psychiatric Medications: Seroquel. 3. Behavioral recommendation(s):   -Go to [employer] bus hub for reduced-fare bus ID -Contact Medicaid transportation line for application -Consider [facility] for services  SUBJECTIVE: Pt. referred by Scot Juniffany Noel for community resources/ supportive counseling:  Pt. reports the following symptoms/concerns: Pt moved to [place] from [place] 6 weeks ago, has Medicaid and disability, AIDS positive, needs help navigating community resources, denies any depression or anxiety.  Duration of problem: 6 weeks Severity: mild  OBJECTIVE: Orientation & Cognition: Oriented x3. Thought processes normal and appropriate to situation. Mood: appropriate. Affect: appropriate Appearance: appropriate Risk of harm to self or others: no risk of harm to self or others Substance use: none Assessments administered: PHQ2: 0  Diagnosis: Psychosocial stressors CPT Code: Z65.8 -------------------------------------------- Other(s) present in the room: female friend  Time spent with patient in exam room: 16 minutes

## 2015-10-19 ENCOUNTER — Other Ambulatory Visit (HOSPITAL_COMMUNITY)
Admission: RE | Admit: 2015-10-19 | Discharge: 2015-10-19 | Disposition: A | Discharge status: Home / Self Care | Payer: Medicaid Other | Source: Ambulatory Visit | Attending: Infectious Diseases | Admitting: Infectious Diseases

## 2015-10-19 ENCOUNTER — Other Ambulatory Visit: Payer: Self-pay | Admitting: *Deleted

## 2015-10-19 ENCOUNTER — Other Ambulatory Visit: Payer: Medicaid Other

## 2015-10-19 DIAGNOSIS — Z113 Encounter for screening for infections with a predominantly sexual mode of transmission: Secondary | ICD-10-CM

## 2015-10-19 DIAGNOSIS — B20 Human immunodeficiency virus [HIV] disease: Secondary | ICD-10-CM

## 2015-10-19 DIAGNOSIS — Z79899 Other long term (current) drug therapy: Secondary | ICD-10-CM

## 2015-10-19 LAB — COMPLETE METABOLIC PANEL WITH GFR
ALT: 5 U/L — AB (ref 6–29)
AST: 12 U/L (ref 10–35)
Albumin: 3.9 g/dL (ref 3.6–5.1)
Alkaline Phosphatase: 82 U/L (ref 33–115)
BUN: 13 mg/dL (ref 7–25)
CHLORIDE: 105 mmol/L (ref 98–110)
CO2: 26 mmol/L (ref 20–31)
Calcium: 8.6 mg/dL (ref 8.6–10.2)
Creat: 0.74 mg/dL (ref 0.50–1.10)
GFR, Est African American: >89 mL/min (ref >=60)
GFR, Est Non African American: >89 mL/min (ref >=60)
GLUCOSE: 85 mg/dL (ref 65–99)
POTASSIUM: 4.4 mmol/L (ref 3.5–5.3)
SODIUM: 135 mmol/L (ref 135–146)
Total Bilirubin: 0.4 mg/dL (ref 0.2–1.2)
Total Protein: 8.1 g/dL (ref 6.1–8.1)

## 2015-10-19 LAB — CBC WITH DIFFERENTIAL/PLATELET
BASOS ABS: 0 10*3/uL (ref 0.0–0.1)
BASOS PCT: 1 % (ref 0–1)
EOS PCT: 19 % — AB (ref 0–5)
Eosinophils Absolute: 0.6 10*3/uL (ref 0.0–0.7)
HCT: 27.4 % — ABNORMAL LOW (ref 36.0–46.0)
HEMOGLOBIN: 9.1 g/dL — AB (ref 12.0–15.0)
Lymphocytes Relative: 26 % (ref 12–46)
Lymphs Abs: 0.8 10*3/uL (ref 0.7–4.0)
MCH: 25.7 pg — ABNORMAL LOW (ref 26.0–34.0)
MCHC: 33.2 g/dL (ref 30.0–36.0)
MCV: 77.4 fL — ABNORMAL LOW (ref 78.0–100.0)
MONO ABS: 0.5 10*3/uL (ref 0.1–1.0)
MPV: 9 fL (ref 8.6–12.4)
Monocytes Relative: 15 % — ABNORMAL HIGH (ref 3–12)
NEUTROS ABS: 1.2 10*3/uL — AB (ref 1.7–7.7)
Neutrophils Relative %: 39 % — ABNORMAL LOW (ref 43–77)
Platelets: 202 10*3/uL (ref 150–400)
RBC: 3.54 MIL/uL — AB (ref 3.87–5.11)
RDW: 16 % — AB (ref 11.5–15.5)
WBC: 3 10*3/uL — AB (ref 4.0–10.5)

## 2015-10-19 LAB — LIPID PANEL
Cholesterol: 182 mg/dL (ref 125–200)
HDL: 53 mg/dL (ref >=46)
LDL CALC: 117 mg/dL (ref <130)
Total CHOL/HDL Ratio: 3.4 Ratio (ref <=5.0)
Triglycerides: 58 mg/dL (ref <150)
VLDL: 12 mg/dL (ref <30)

## 2015-10-20 ENCOUNTER — Ambulatory Visit: Payer: Medicaid Other | Admitting: Family Medicine

## 2015-10-20 ENCOUNTER — Emergency Department (HOSPITAL_COMMUNITY)
Admission: EM | Admit: 2015-10-20 | Discharge: 2015-10-20 | Disposition: A | Discharge status: Home / Self Care | Payer: Medicaid Other | Attending: Emergency Medicine | Admitting: Emergency Medicine

## 2015-10-20 ENCOUNTER — Encounter (HOSPITAL_COMMUNITY): Payer: Self-pay | Admitting: Emergency Medicine

## 2015-10-20 DIAGNOSIS — F17200 Nicotine dependence, unspecified, uncomplicated: Secondary | ICD-10-CM | POA: Diagnosis not present

## 2015-10-20 DIAGNOSIS — G629 Polyneuropathy, unspecified: Secondary | ICD-10-CM | POA: Insufficient documentation

## 2015-10-20 DIAGNOSIS — Z7951 Long term (current) use of inhaled steroids: Secondary | ICD-10-CM | POA: Diagnosis not present

## 2015-10-20 DIAGNOSIS — W260XXA Contact with knife, initial encounter: Secondary | ICD-10-CM | POA: Diagnosis not present

## 2015-10-20 DIAGNOSIS — Z21 Asymptomatic human immunodeficiency virus [HIV] infection status: Secondary | ICD-10-CM | POA: Insufficient documentation

## 2015-10-20 DIAGNOSIS — S51811A Laceration without foreign body of right forearm, initial encounter: Secondary | ICD-10-CM | POA: Diagnosis not present

## 2015-10-20 DIAGNOSIS — Y998 Other external cause status: Secondary | ICD-10-CM | POA: Insufficient documentation

## 2015-10-20 DIAGNOSIS — Y9289 Other specified places as the place of occurrence of the external cause: Secondary | ICD-10-CM | POA: Diagnosis not present

## 2015-10-20 DIAGNOSIS — Z23 Encounter for immunization: Secondary | ICD-10-CM | POA: Diagnosis not present

## 2015-10-20 DIAGNOSIS — Z79899 Other long term (current) drug therapy: Secondary | ICD-10-CM | POA: Insufficient documentation

## 2015-10-20 DIAGNOSIS — IMO0002 Reserved for concepts with insufficient information to code with codable children: Secondary | ICD-10-CM

## 2015-10-20 DIAGNOSIS — Y93G3 Activity, cooking and baking: Secondary | ICD-10-CM | POA: Diagnosis not present

## 2015-10-20 LAB — RPR

## 2015-10-20 LAB — T-HELPER CELL (CD4) - (RCID CLINIC ONLY)
CD4 T CELL ABS: 60 /uL — AB (ref 400–2700)
CD4 T CELL HELPER: 8 % — AB (ref 33–55)

## 2015-10-20 MED ORDER — BACITRACIN ZINC 500 UNIT/GM EX OINT
1.0000 "application " | TOPICAL_OINTMENT | Freq: Two times a day (BID) | CUTANEOUS | Status: DC
  Administered 2015-10-20: 1 via TOPICAL

## 2015-10-20 MED ORDER — LIDOCAINE-EPINEPHRINE 1 %-1:100000 IJ SOLN
10.0000 mL | Freq: Once | INTRAMUSCULAR | Status: AC
  Administered 2015-10-20: 10 mL
  Filled 2015-10-20: qty 1

## 2015-10-20 MED ORDER — TETANUS-DIPHTH-ACELL PERTUSSIS 5-2.5-18.5 LF-MCG/0.5 IM SUSP
0.5000 mL | Freq: Once | INTRAMUSCULAR | Status: AC
  Administered 2015-10-20: 0.5 mL via INTRAMUSCULAR
  Filled 2015-10-20: qty 0.5

## 2015-10-20 NOTE — Discharge Instructions (Signed)
Sutured Wound Care °Sutures are stitches that can be used to close wounds. Taking care of your wound properly can help to prevent pain and infection. It can also help your wound to heal more quickly. °HOW TO CARE FOR YOUR SUTURED WOUND °Wound Care °· Keep the wound clean and dry. °· If you were given a bandage (dressing), you should change it at least once per day or as directed by your health care provider. You should also change it if it becomes wet or dirty. °· Keep the wound completely dry for the first 24 hours or as directed by your health care provider. After that time, you may shower or bathe. However, make sure that the wound is not soaked in water until the sutures have been removed. °· Clean the wound one time each day or as directed by your health care provider. °¨ Wash the wound with soap and water. °¨ Rinse the wound with water to remove all soap. °¨ Pat the wound dry with a clean towel. Do not rub the wound. °· After cleaning the wound, apply a thin layer of antibiotic ointment as directed by your health care provider. This will help to prevent infection and keep the dressing from sticking to the wound. °· Have the sutures removed as directed by your health care provider. °General Instructions °· Take or apply medicines only as directed by your health care provider. °· To help prevent scarring, make sure to cover your wound with sunscreen whenever you are outside after the sutures are removed and the wound is healed. Make sure to wear a sunscreen of at least 30 SPF. °· If you were prescribed an antibiotic medicine or ointment, finish all of it even if you start to feel better. °· Do not scratch or pick at the wound. °· Keep all follow-up visits as directed by your health care provider. This is important. °· Check your wound every day for signs of infection. Watch for:   °· Redness, swelling, or pain. °· Fluid, blood, or pus. °· Raise (elevate) the injured area above the level of your heart while you  are sitting or lying down, if possible. °· Avoid stretching your wound. °· Drink enough fluids to keep your urine clear or pale yellow. °SEEK MEDICAL CARE IF: °· You received a tetanus shot and you have swelling, severe pain, redness, or bleeding at the injection site. °· You have a fever. °· A wound that was closed breaks open. °· You notice a bad smell coming from the wound. °· You notice something coming out of the wound, such as wood or glass. °· Your pain is not controlled with medicine. °· You have increased redness, swelling, or pain at the site of your wound. °· You have fluid, blood, or pus coming from your wound. °· You notice a change in the color of your skin near your wound. °· You need to change the dressing frequently due to fluid, blood, or pus draining from the wound. °· You develop a new rash. °· You develop numbness around the wound. °SEEK IMMEDIATE MEDICAL CARE IF: °· You develop severe swelling around the injury site. °· Your pain suddenly increases and is severe. °· You develop painful lumps near the wound or on skin that is anywhere on your body. °· You have a red streak going away from your wound. °· The wound is on your hand or foot and you cannot properly move a finger or toe. °· The wound is on your hand or foot and   you notice that your fingers or toes look pale or bluish. °  °This information is not intended to replace advice given to you by your health care provider. Make sure you discuss any questions you have with your health care provider. °  °Document Released: 01/09/2005 Document Revised: 04/18/2015 Document Reviewed: 07/14/2013 °Elsevier Interactive Patient Education ©2016 Elsevier Inc[employer] ° °Laceration Care, Adult °A laceration is a cut that goes through all of the layers of the skin and into the tissue that is right under the skin. Some lacerations heal on their own. Others need to be closed with stitches (sutures), staples, skin adhesive strips, or skin glue. Proper laceration care  minimizes the risk of infection and helps the laceration to heal better. °HOW TO CARE FOR YOUR LACERATION °If sutures or staples were used: °· Keep the wound clean and dry. °· If you were given a bandage (dressing), you should change it at least one time per day or as told by your health care provider. You should also change it if it becomes wet or dirty. °· Keep the wound completely dry for the first 24 hours or as told by your health care provider. After that time, you may shower or bathe. However, make sure that the wound is not soaked in water until after the sutures or staples have been removed. °· Clean the wound one time each day or as told by your health care provider: °¨ Wash the wound with soap and water. °¨ Rinse the wound with water to remove all soap. °¨ Pat the wound dry with a clean towel. Do not rub the wound. °· After cleaning the wound, apply a thin layer of antibiotic ointment as told by your health care provider. This will help to prevent infection and keep the dressing from sticking to the wound. °· Have the sutures or staples removed as told by your health care provider. °If skin adhesive strips were used: °· Keep the wound clean and dry. °· If you were given a bandage (dressing), you should change it at least one time per day or as told by your health care provider. You should also change it if it becomes dirty or wet. °· Do not get the skin adhesive strips wet. You may shower or bathe, but be careful to keep the wound dry. °· If the wound gets wet, pat it dry with a clean towel. Do not rub the wound. °· Skin adhesive strips fall off on their own. You may trim the strips as the wound heals. Do not remove skin adhesive strips that are still stuck to the wound. They will fall off in time. °If skin glue was used: °· Try to keep the wound dry, but you may briefly wet it in the shower or bath. Do not soak the wound in water, such as by swimming. °· After you have showered or bathed, gently pat the  wound dry with a clean towel. Do not rub the wound. °· Do not do any activities that will make you sweat heavily until the skin glue has fallen off on its own. °· Do not apply liquid, cream, or ointment medicine to the wound while the skin glue is in place. Using those may loosen the film before the wound has healed. °· If you were given a bandage (dressing), you should change it at least one time per day or as told by your health care provider. You should also change it if it becomes dirty or wet. °· If a dressing   is placed over the wound, be careful not to apply tape directly over the skin glue. Doing that may cause the glue to be pulled off before the wound has healed. °· Do not pick at the glue. The skin glue usually remains in place for 5-10 days, then it falls off of the skin. °General Instructions °· Take over-the-counter and prescription medicines only as told by your health care provider. °· If you were prescribed an antibiotic medicine or ointment, take or apply it as told by your doctor. Do not stop using it even if your condition improves. °· To help prevent scarring, make sure to cover your wound with sunscreen whenever you are outside after stitches are removed, after adhesive strips are removed, or when glue remains in place and the wound is healed. Make sure to wear a sunscreen of at least 30 SPF. °· Do not scratch or pick at the wound. °· Keep all follow-up visits as told by your health care provider. This is important. °· Check your wound every day for signs of infection. Watch for: °¨ Redness, swelling, or pain. °¨ Fluid, blood, or pus. °· Raise (elevate) the injured area above the level of your heart while you are sitting or lying down, if possible. °SEEK MEDICAL CARE IF: °· You received a tetanus shot and you have swelling, severe pain, redness, or bleeding at the injection site. °· You have a fever. °· A wound that was closed breaks open. °· You notice a bad smell coming from your wound or your  dressing. °· You notice something coming out of the wound, such as wood or glass. °· Your pain is not controlled with medicine. °· You have increased redness, swelling, or pain at the site of your wound. °· You have fluid, blood, or pus coming from your wound. °· You notice a change in the color of your skin near your wound. °· You need to change the dressing frequently due to fluid, blood, or pus draining from the wound. °· You develop a new rash. °· You develop numbness around the wound. °SEEK IMMEDIATE MEDICAL CARE IF: °· You develop severe swelling around the wound. °· Your pain suddenly increases and is severe. °· You develop painful lumps near the wound or on skin that is anywhere on your body. °· You have a red streak going away from your wound. °· The wound is on your hand or foot and you cannot properly move a finger or toe. °· The wound is on your hand or foot and you notice that your fingers or toes look pale or bluish. °  °This information is not intended to replace advice given to you by your health care provider. Make sure you discuss any questions you have with your health care provider. °  °Document Released: 12/02/2005 Document Revised: 04/18/2015 Document Reviewed: 11/28/2014 °Elsevier Interactive Patient Education ©2016 Elsevier Inc. ° °

## 2015-10-20 NOTE — ED Notes (Signed)
Bed: WTR7 Expected date:  Expected time:  Means of arrival:  Comments: laceration

## 2015-10-20 NOTE — ED Notes (Signed)
Per EMS- pt was preparing dinner, knife slipped and had laceration to right anterior forearm. VSS. Hx HIV positive, has scars from prior self-mutilation but was clear that this was not a self-mutilation attempt.

## 2015-10-20 NOTE — ED Notes (Signed)
AVS explained in detail. Bacitracin applied with Telfa dressing and dry dressing. Knows appropriate suture care and follow up with 7-10 days for suture removal. No other c/c. Bus ticket given.

## 2015-10-20 NOTE — ED Provider Notes (Signed)
CSN: [id number]   Arrival date & time 10/20/15 2002  History  By signing my name below, I, Meagan Mcclure, attest that this documentation has been prepared under the direction and in the presence of Danelle Berry PA-C Electronically Signed: Bethel Mcclure, ED Scribe. 10/20/2015. 10:00 PM. Chief Complaint  Patient presents with  . Extremity Laceration    HPI The history is provided by the patient. No language interpreter was used.   Brought in by EMS, Meagan Mcclure is a 44 y.o. female with PMHX of AIDS who presents to the Emergency Department complaining of a laceration at the anterior right forearm with onset 30 minutes PTA. Pt was slicing cheese with a hunting knife and cut her arm. She rinsed the arm off and bandaged the arm PTA. Associated symptoms include numbness in the fingertips of the right hand. Pt denies light headedness and intentional self harm.  Left hand dominant. Last tetanus is unknown. NKDA.  Past Medical History  Diagnosis Date  . AIDS (acquired immune deficiency syndrome) (HCC)   . Neuropathy [facility])     Past Surgical History  Procedure Laterality Date  . Appendectomy      Family History  Problem Relation Age of Onset  . Diabetes Mother   . Diabetes Brother     Social History  Substance Use Topics  . Smoking status: Current Every Day Smoker -- 0.25 packs/day  . Smokeless tobacco: None  . Alcohol Use: No     Review of Systems  Musculoskeletal:       2 lacerations at anterior right forearm  Psychiatric/Behavioral: Negative for self-injury.   Home Medications   Prior to Admission medications   Medication Sig Start Date End Date Taking? Authorizing Provider  albuterol (PROVENTIL HFA;VENTOLIN HFA) 108 (90 BASE) MCG/ACT inhaler Inhale 1 puff into the lungs every 6 (six) hours as needed for wheezing or shortness of breath. 10/06/15   Tiffany Netta Cedars, PA-C  beclomethasone (QVAR) 40 MCG/ACT inhaler Inhale 1 puff into the lungs 2 (two) times daily.  10/06/15   Vivianne Master, PA-C  dronabinol (MARINOL) 10 MG capsule Take 1 capsule (10 mg total) by mouth 2 (two) times daily before a meal. 11/03/15   Ginnie Smart, MD  elvitegravir-cobicistat-emtricitabine-tenofovir (GENVOYA) 150-150-200-10 MG TABS tablet Take 1 tablet by mouth daily with breakfast. 11/03/15   Ginnie Smart, MD  gabapentin (NEURONTIN) 400 MG capsule Take 1 capsule (400 mg total) by mouth 4 (four) times daily. 10/24/15   Massie Maroon, FNP  methocarbamol (ROBAXIN-750) 750 MG tablet Take 1 tablet (750 mg total) by mouth 4 (four) times daily. 10/06/15   Vivianne Master, PA-C  oxyCODONE-acetaminophen (PERCOCET/ROXICET) 5-325 MG tablet Take 1-2 tablets by mouth every 4 (four) hours as needed for severe pain. 09/27/15   Lorre Nick, MD  permethrin (ACTICIN) 5 % cream Apply 1 application topically once. Patient not taking: Reported on 11/03/2015 10/06/15   Vivianne Master, PA-C  QUEtiapine (SEROQUEL) 100 MG tablet Take 1 tablet (100 mg total) by mouth at bedtime. 10/06/15   Tiffany Netta Cedars, PA-C  QUEtiapine (SEROQUEL) 50 MG tablet Take 1 tablet (50 mg total) by mouth every morning. 10/06/15   Tiffany Netta Cedars, PA-C  sildenafil (REVATIO) 20 MG tablet Take 1 tablet (20 mg total) by mouth 3 (three) times daily. 10/06/15   Vivianne Master, PA-C    Allergies  Review of patient's allergies indicates no known allergies.  Triage Vitals: BP 126/86 mmHg  Pulse 112  Temp(Src) 98.4  F (36.9 C) (Oral)  Resp 16  SpO2 100%  LMP 10/05/2015 (Exact Date)  Physical Exam  Constitutional: She is oriented to person, place, and time. She appears well-developed and well-nourished. No distress.  HENT:  Head: Normocephalic and atraumatic.  Right Ear: External ear normal.  Left Ear: External ear normal.  Nose: Nose normal.  Mouth/Throat: Oropharynx is clear and moist. No oropharyngeal exudate.  Eyes: Conjunctivae and EOM are normal. Pupils are equal, round, and reactive to light. Right eye  exhibits no discharge. Left eye exhibits no discharge. No scleral icterus.  Neck: Normal range of motion. Neck supple. No JVD present. No tracheal deviation present.  Cardiovascular: Normal rate and regular rhythm.   Pulmonary/Chest: Effort normal and breath sounds normal. No stridor. No respiratory distress.  Musculoskeletal: Normal range of motion. She exhibits no edema.       Arms: Lymphadenopathy:    She has no cervical adenopathy.  Neurological: She is alert and oriented to person, place, and time. She exhibits normal muscle tone. Coordination normal.  Skin: Skin is warm and dry. No rash noted. She is not diaphoretic. No erythema. No pallor.  Psychiatric: She has a normal mood and affect. Her behavior is normal. Judgment and thought content normal.    ED Course  Procedures  LACERATION REPAIR Performed by: Danelle BerryLeisa Jalien Weakland PA-C Consent: Verbal consent obtained. Risks and benefits: risks, benefits and alternatives were discussed Patient identity confirmed: provided demographic data Time out performed prior to procedure Prepped and Draped in normal sterile fashion Wound explored Laceration Location: right anterior forearm Laceration Length: 2.5 cm and 2 cm  No Foreign Bodies seen or palpated Anesthesia: local infiltration Local anesthetic: lidocaine 1% with epinephrine Anesthetic total: 5 ml Irrigation method: syringe Amount of cleaning: standard Skin closure: 5-0 Prolene Number of sutures: 9 Technique: simple interrupted Patient tolerance: Patient tolerated the procedure well with no immediate complications.  DIAGNOSTIC STUDIES: Oxygen Saturation is 100% on RA,  normal by my interpretation.    COORDINATION OF CARE: 8:28 PM Discussed treatment plan which includes laceration repair and Tdap with pt at bedside and pt agreed to the plan.  MDM   Final diagnoses:  Laceration   Laceration to right forearm 2 gaping areas, closed with simple interrupted suture using 5.0  prolene Total of 9 sutures. No neurovascular involvement.  Pt's pulses and sensation intact. Tdap updated.  Wound care reviewed with the pt. She was discharged home in good condition.   I personally performed the services described in this documentation, which was scribed in my presence. The recorded information has been reviewed and is accurate.        Danelle BerryLeisa Obrien Huskins, PA-C 11/06/15 [id number]  Raeford RazorStephen Kohut, MD 11/06/15 [phone]

## 2015-10-22 LAB — HIV-1 RNA QUANT-NO REFLEX-BLD
HIV 1 RNA QUANT: 48188 {copies}/mL — AB (ref <20)
HIV-1 RNA QUANT, LOG: 4.68 {Log_copies}/mL — AB (ref <1.30)

## 2015-10-23 LAB — URINE CYTOLOGY ANCILLARY ONLY
CHLAMYDIA, DNA PROBE: NEGATIVE
Neisseria Gonorrhea: NEGATIVE

## 2015-10-24 ENCOUNTER — Encounter: Payer: Self-pay | Admitting: Family Medicine

## 2015-10-24 ENCOUNTER — Ambulatory Visit (INDEPENDENT_AMBULATORY_CARE_PROVIDER_SITE_OTHER): Payer: Medicaid Other | Admitting: Family Medicine

## 2015-10-24 VITALS — BP 127/84 | HR 80 | Temp 98.1°F | Resp 14 | Ht 63.0 in | Wt 133.0 lb

## 2015-10-24 DIAGNOSIS — F17200 Nicotine dependence, unspecified, uncomplicated: Secondary | ICD-10-CM

## 2015-10-24 DIAGNOSIS — Z8659 Personal history of other mental and behavioral disorders: Secondary | ICD-10-CM

## 2015-10-24 DIAGNOSIS — B20 Human immunodeficiency virus [HIV] disease: Secondary | ICD-10-CM

## 2015-10-24 DIAGNOSIS — R296 Repeated falls: Secondary | ICD-10-CM

## 2015-10-24 DIAGNOSIS — G909 Disorder of the autonomic nervous system, unspecified: Secondary | ICD-10-CM

## 2015-10-24 DIAGNOSIS — Z7189 Other specified counseling: Secondary | ICD-10-CM

## 2015-10-24 DIAGNOSIS — G629 Polyneuropathy, unspecified: Secondary | ICD-10-CM

## 2015-10-24 DIAGNOSIS — Z7689 Persons encountering health services in other specified circumstances: Secondary | ICD-10-CM

## 2015-10-24 MED ORDER — GABAPENTIN 400 MG PO CAPS: 400.0000 mg | ORAL_CAPSULE | Freq: Four times a day (QID) | ORAL | Status: AC

## 2015-10-24 NOTE — Patient Instructions (Addendum)
Patient will follow up at [facility].  [address] [place], [place]  [phone]      Bipolar Disorder Bipolar disorder is a mental illness. The term bipolar disorder actually is used to describe a group of disorders that all share varying degrees of emotional highs and lows that can interfere with daily functioning, such as work, school, or relationships. Bipolar disorder also can lead to drug abuse, hospitalization, and suicide. The emotional highs of bipolar disorder are periods of elation or irritability and high energy. These highs can range from a mild form (hypomania) to a severe form (mania). People experiencing episodes of hypomania may appear energetic, excitable, and highly productive. People experiencing mania may behave impulsively or erratically. They often make poor decisions. They may have difficulty sleeping. The most severe episodes of mania can involve having very distorted beliefs or perceptions about the world and seeing or hearing things that are not real (psychotic delusions and hallucinations).  The emotional lows of bipolar disorder (depression) also can range from mild to severe. Severe episodes of bipolar depression can involve psychotic delusions and hallucinations. Sometimes people with bipolar disorder experience a state of mixed mood. Symptoms of hypomania or mania and depression are both present during this mixed-mood episode. SIGNS AND SYMPTOMS There are signs and symptoms of the episodes of hypomania and mania as well as the episodes of depression. The signs and symptoms of hypomania and mania are similar but vary in severity. They include:  Inflated self-esteem or feeling of increased self-confidence.  Decreased need for sleep.  Unusual talkativeness (rapid or pressured speech) or the feeling of a need to keep talking.  Sensation of racing thoughts or constant talking, with quick shifts between topics that may or may not be related  (flight of ideas).  Decreased ability to focus or concentrate.  Increased purposeful activity, such as work, studies, or social activity, or nonproductive activity, such as pacing, squirming and fidgeting, or finger and toe tapping.  Impulsive behavior and use of poor judgment, resulting in high-risk activities, such as having unprotected sex or spending excessive amounts of money. Signs and symptoms of depression include the following:   Feelings of sadness, hopelessness, or helplessness.  Frequent or uncontrollable episodes of crying.  Lack of feeling anything or caring about anything.  Difficulty sleeping or sleeping too much.  Inability to enjoy the things you used to enjoy.   Desire to be alone all the time.   Feelings of guilt or worthlessness.  Lack of energy or motivation.   Difficulty concentrating, remembering, or making decisions.  Change in appetite or weight beyond normal fluctuations.  Thoughts of death or the desire to harm yourself. DIAGNOSIS  Bipolar disorder is diagnosed through an assessment by your caregiver. Your caregiver will ask questions about your emotional episodes. There are two main types of bipolar disorder. People with type I bipolar disorder have manic episodes with or without depressive episodes. People with type II bipolar disorder have hypomanic episodes and major depressive episodes, which are more serious than mild depression. The type of bipolar disorder you have can make an important difference in how your illness is monitored and treated. Your caregiver may ask questions about your medical history and use of alcohol or drugs, including prescription medication. Certain medical conditions and substances also can cause emotional highs and lows that resemble bipolar disorder (secondary bipolar disorder).  TREATMENT  Bipolar disorder is a long-term illness. It is best controlled with continuous treatment rather than treatment only  when  symptoms occur. The following treatments can be prescribed for bipolar disorders:  Medication--Medication can be prescribed by a doctor that is an expert in treating mental disorders (psychiatrists). Medications called mood stabilizers are usually prescribed to help control the illness. Other medications are sometimes added if symptoms of mania, depression, or psychotic delusions and hallucinations occur despite the use of a mood stabilizer.  Talk therapy--Some forms of talk therapy are helpful in providing support, education, and guidance. A combination of medication and talk therapy is best for managing the disorder over time. A procedure in which electricity is applied to your brain through your scalp (electroconvulsive therapy) is used in cases of severe mania when medication and talk therapy do not work or work too slowly.   This information is not intended to replace advice given to you by your health care provider. Make sure you discuss any questions you have with your health care provider.   Document Released: 03/10/2001 Document Revised: 12/23/2014 Document Reviewed: 12/28/2012 Elsevier Interactive Patient Education [employer]2016 Elsevier Inc.

## 2015-10-24 NOTE — Progress Notes (Signed)
Subjective:    Patient ID: Meagan Mcclure, female    DOB: 06-07-1971, 44 y.o.   MRN: [id number]  HPI Meagan Mcclure is a 44 y.o. Female that presents accompanied by husband to establish care. [name] was initially evaluated in the emergency department on October 12th with a chief complaint of low back pain radiating to lower extremities. Patient also report history of falls, claiming that legs "give out". Imaging studies were normal during emergency room visit. She was discharged on Robaxin and Percocet. She maintains that she completed course of medications. She states that Robaxin helped her to relax, but percocet does not help pain. She has been on Lyrica and Gabapentin in the past with minimal relief. She maintains that pain intensity is 4-5/10 described as shooting and burning. She as ascending numbness, it starts at her feet and tends to move up her body. She is unable to ambulate without discomfort.   Patient was evaluated in the emergency department on 10/20/2015 for a laceration to the right anterior forearm. Patient states that she was slicing cheese and accidentally cut right forearm. Patient received sutures without complication. She states that she is to have sutures removed in 7-10 days.    Patient has a history of HIV that was diagnosed some time ago. and has been without anti-viral medications since last April due to insurance constraints. Patient relocated from [place].   Past Medical History  Diagnosis Date  . AIDS (acquired immune deficiency syndrome) [facility](HCC)    Social History   Social History  . Marital Status: Married    Spouse Name: N/A  . Number of Children: N/A  . Years of Education: N/A   Occupational History  . Not on file.   Social History Main Topics  . Smoking status: Current Every Day Smoker -- 0.25 packs/day  . Smokeless tobacco: Not on file  . Alcohol Use: No  . Drug Use: No  . Sexual Activity: Not on file   Other Topics Concern  .  Not on file   Social History Narrative   Review of Systems  Eyes: Negative for photophobia.  Respiratory: Negative for shortness of breath and stridor.   Cardiovascular: Negative.   Gastrointestinal: Negative.   Endocrine: Negative for polydipsia, polyphagia and polyuria.  Genitourinary: Negative for dysuria, urgency, vaginal bleeding, vaginal discharge and difficulty urinating.  Musculoskeletal: Positive for arthralgias.  Skin: Positive for wound (Sutures to right forearm). Negative for rash.  Neurological: Negative for tremors, syncope and weakness.  Hematological: Negative.   Psychiatric/Behavioral: Positive for suicidal ideas and sleep disturbance. Negative for self-injury and decreased concentration. The patient is nervous/anxious.        Stress is primary trigger for anxiety. History of bipolar depression. She was previously on Seraquel and Xanax. Currently denies suicidal or homicidal ideations.         Objective:   Physical Exam  Constitutional: Vital signs are normal. She appears well-developed.  HENT:  Head: Normocephalic and atraumatic.  Right Ear: Hearing, external ear and ear canal normal. Tympanic membrane is scarred.  Left Ear: Hearing, external ear and ear canal normal. Tympanic membrane is scarred.  Nose: Nose normal.  Mouth/Throat: Oropharynx is clear and moist and mucous membranes are normal. Abnormal dentition (partially edentulous). Dental caries present.  Eyes: Conjunctivae, EOM and lids are normal. Pupils are equal, round, and reactive to light.  Neck: Trachea normal.  Cardiovascular: Normal rate, regular rhythm and normal pulses.   Pulmonary/Chest: Effort normal and breath sounds normal.  Abdominal: Soft.  Normal appearance. There is no tenderness.  Musculoskeletal:       Lumbar back: She exhibits decreased range of motion, tenderness and pain. She exhibits no swelling, no edema, no deformity, no spasm and normal pulse.  Lymphadenopathy:       Head (right  side): No submental and no submandibular adenopathy present.       Head (left side): No submental and no submandibular adenopathy present.  Neurological: She is alert. No cranial nerve deficit or sensory deficit. Gait abnormal.  Skin: Laceration noted.  Right anterior forearm laceration, approximated, clean, dry, and intact without signs of infection. No drainage or erythema noted.       BP 127/84 mmHg  Pulse 80  Temp(Src) 98.1 F (36.7 C) (Oral)  Resp 14  Ht  (1.6 m)  Wt 133 lb (60.328 kg)  BMI 23.57 kg/m2  LMP 10/05/2015 (Exact Date) Assessment & Plan:    1. Human immunodeficiency virus (HIV) disease (HCC) Patient has a history of HIV. Reviewed recent laboratory values. Patient is scheduled with Dr. Ninetta Lights on 11/18 to start anti-viral medication regimen. Reminded patient of the importance of keeping appointment, she expressed understanding.   2. HIV-1 associated autonomic neuropathy (HCC) I suspect that neuropathy is related to HIV, patient has been without medications since April and neuropathy has worsened since that time. We will explore sending a referral to neurology after appointment with Dr. Ninetta Lights. She will follow up with me in 1 month.   3. Frequent falls Refer to #2  4. Neuropathy (HCC) Patient has been on gabapentin inconsistently in the past. Will start a trial of gabapentin at a higher dose and follow up in 1 month.  - gabapentin (NEURONTIN) 400 MG capsule; Take 1 capsule (400 mg total) by mouth 4 (four) times daily.  Dispense: 120 capsule; Refill: 0  5. Tobacco use disorder Smoking cessation instruction/counseling given:  counseled patient on the dangers of tobacco use, advised patient to stop smoking, and reviewed strategies to maximize success  6. History of bipolar disorder Patient reports a history of bipolar disorder. She states that she was managed on Seroquel. She is currently out of medication. I recommend that she follow up with [facility] on today during walk-in hours. Patient was given address. She denies suicidal or homicidal intent. She also denies visual or auditory hallucinations.   7. Encounter to establish care Recommend scheduling a pap smear. Patient reports a history of irregular menstrual cycles. She states that she has 8 children, her youngest is 3. Will review medical records from [facility] as they become availale as they become available Recommend yearly eye exam Recommend dental examination Recommend vaccinations. Will review vaccination history.     RTC: 1 month The patient was given clear instructions to go to ER or return to medical center if symptoms do not improve, worsen or new problems develop. The patient verbalized understanding. Will notify patient with laboratory results. Massie Maroon, FNP

## 2015-10-25 DIAGNOSIS — Z8659 Personal history of other mental and behavioral disorders: Secondary | ICD-10-CM | POA: Insufficient documentation

## 2015-10-25 DIAGNOSIS — B20 Human immunodeficiency virus [HIV] disease: Secondary | ICD-10-CM | POA: Insufficient documentation

## 2015-10-25 DIAGNOSIS — G629 Polyneuropathy, unspecified: Secondary | ICD-10-CM | POA: Insufficient documentation

## 2015-10-25 DIAGNOSIS — R296 Repeated falls: Secondary | ICD-10-CM | POA: Insufficient documentation

## 2015-11-03 ENCOUNTER — Encounter: Payer: Self-pay | Admitting: *Deleted

## 2015-11-03 ENCOUNTER — Encounter: Payer: Self-pay | Admitting: Infectious Diseases

## 2015-11-03 ENCOUNTER — Ambulatory Visit (INDEPENDENT_AMBULATORY_CARE_PROVIDER_SITE_OTHER): Payer: Medicaid Other | Admitting: Infectious Diseases

## 2015-11-03 VITALS — BP 118/82 | HR 85 | Temp 97.7°F | Wt 133.0 lb

## 2015-11-03 DIAGNOSIS — F17200 Nicotine dependence, unspecified, uncomplicated: Secondary | ICD-10-CM | POA: Diagnosis not present

## 2015-11-03 DIAGNOSIS — E7401 von Gierke disease: Secondary | ICD-10-CM | POA: Insufficient documentation

## 2015-11-03 DIAGNOSIS — Z23 Encounter for immunization: Secondary | ICD-10-CM | POA: Diagnosis not present

## 2015-11-03 DIAGNOSIS — B20 Human immunodeficiency virus [HIV] disease: Secondary | ICD-10-CM

## 2015-11-03 DIAGNOSIS — G909 Disorder of the autonomic nervous system, unspecified: Secondary | ICD-10-CM

## 2015-11-03 DIAGNOSIS — G629 Polyneuropathy, unspecified: Secondary | ICD-10-CM

## 2015-11-03 MED ORDER — DRONABINOL 10 MG PO CAPS: 10.0000 mg | ORAL_CAPSULE | Freq: Two times a day (BID) | ORAL | Status: DC

## 2015-11-03 MED ORDER — ELVITEG-COBIC-EMTRICIT-TENOFAF 150-150-200-10 MG PO TABS: 1.0000 | ORAL_TABLET | Freq: Every day | ORAL | Status: AC
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
## 2015-11-03 NOTE — Assessment & Plan Note (Signed)
Encouraged to quit. 

## 2015-11-03 NOTE — Assessment & Plan Note (Signed)
See above

## 2015-11-03 NOTE — Assessment & Plan Note (Addendum)
Give flu shot Offer to change art to [employer] Restart marinol Set up for pap, mammo Given condoms Refer husband for Prep appt Have her eval for vision change.  rtc in 1 month

## 2015-11-03 NOTE — Assessment & Plan Note (Signed)
Will refer to neuro if worsening.  Her PCp is managing her pain meds at this point

## 2015-11-08 ENCOUNTER — Telehealth: Payer: Self-pay | Admitting: *Deleted

## 2015-11-08 NOTE — Telephone Encounter (Signed)
Marinol denied by patient's insurance. Issued appeal and a decision will be reached in 24 hours. Wendall MolaJacqueline Clayten Allcock

## 2015-11-14 ENCOUNTER — Telehealth: Payer: Self-pay | Admitting: *Deleted

## 2015-11-14 NOTE — Telephone Encounter (Signed)
Patient is in an out-patient drug program, has had her regular UDS come up positive.  She would like to know which of her medications may have triggered a positive result for cocaine/benzoylecgonine? Andree CossHowell, Cashius Grandstaff M, RN

## 2015-11-14 NOTE — Telephone Encounter (Signed)
I am not aware of any medications that can do that.

## 2015-11-16 NOTE — Telephone Encounter (Signed)
Left message

## 2015-11-24 ENCOUNTER — Ambulatory Visit (INDEPENDENT_AMBULATORY_CARE_PROVIDER_SITE_OTHER): Payer: Medicaid Other | Admitting: Family Medicine

## 2015-11-24 ENCOUNTER — Other Ambulatory Visit (HOSPITAL_COMMUNITY)
Admission: RE | Admit: 2015-11-24 | Discharge: 2015-11-24 | Disposition: A | Discharge status: Home / Self Care | Payer: Medicaid Other | Source: Ambulatory Visit | Attending: Internal Medicine | Admitting: Internal Medicine

## 2015-11-24 ENCOUNTER — Encounter: Payer: Self-pay | Admitting: Family Medicine

## 2015-11-24 VITALS — BP 127/82 | HR 79 | Temp 98.2°F | Resp 16 | Ht 63.0 in | Wt 137.0 lb

## 2015-11-24 DIAGNOSIS — A499 Bacterial infection, unspecified: Secondary | ICD-10-CM

## 2015-11-24 DIAGNOSIS — N760 Acute vaginitis: Secondary | ICD-10-CM | POA: Diagnosis not present

## 2015-11-24 DIAGNOSIS — Z01411 Encounter for gynecological examination (general) (routine) with abnormal findings: Secondary | ICD-10-CM | POA: Diagnosis not present

## 2015-11-24 DIAGNOSIS — Z01419 Encounter for gynecological examination (general) (routine) without abnormal findings: Secondary | ICD-10-CM

## 2015-11-24 DIAGNOSIS — L298 Other pruritus: Secondary | ICD-10-CM | POA: Diagnosis not present

## 2015-11-24 DIAGNOSIS — N898 Other specified noninflammatory disorders of vagina: Secondary | ICD-10-CM | POA: Diagnosis not present

## 2015-11-24 DIAGNOSIS — B9689 Other specified bacterial agents as the cause of diseases classified elsewhere: Secondary | ICD-10-CM

## 2015-11-24 DIAGNOSIS — B20 Human immunodeficiency virus [HIV] disease: Secondary | ICD-10-CM

## 2015-11-24 DIAGNOSIS — G909 Disorder of the autonomic nervous system, unspecified: Secondary | ICD-10-CM

## 2015-11-24 DIAGNOSIS — F17200 Nicotine dependence, unspecified, uncomplicated: Secondary | ICD-10-CM

## 2015-11-24 LAB — POCT WET + KOH PREP
TRICH BY WET PREP: ABSENT
YEAST BY WET PREP: ABSENT
Yeast by KOH: ABSENT

## 2015-11-24 MED ORDER — FLUCONAZOLE 150 MG PO TABS: 150.0000 mg | ORAL_TABLET | Freq: Once | ORAL | Status: DC

## 2015-11-24 MED ORDER — METRONIDAZOLE 500 MG PO TABS
500.0000 mg | ORAL_TABLET | Freq: Two times a day (BID) | ORAL | Status: DC
Start: 2015-11-24 — End: 2015-12-06

## 2015-11-24 NOTE — Patient Instructions (Signed)

## 2015-11-24 NOTE — Progress Notes (Signed)
Subjective:    Patient ID: Meagan Mcclure, female    DOB: 19-Oct-1971, 44 y.o.   MRN: [id number]  HPI Meagan Shinesngela Mcclure is a 44 y.o female with a history of HIV and neuropathy that presents for a routine gynecological examination. Meagan Mcclure is doing well with minor complaints. She has recently restarted ART therapy. She maintains that she is taking medications consistently. She denies fever, fatigue, nausea, vomiting, or diarrhea.    Patient states that neuropathy has improved since re-starting ART therapy. She was started on a trial of gabapentin 1 month ago.  She has a history ascending numbness, it starts at her feet and tends to move up her body. Symptoms have improved.    Past Medical History  Diagnosis Date  . AIDS (acquired immune deficiency syndrome) (HCC)   . Neuropathy [facility](HCC)    Social History   Social History  . Marital Status: Married    Spouse Name: N/A  . Number of Children: N/A  . Years of Education: N/A   Occupational History  . Not on file.   Social History Main Topics  . Smoking status: Current Every Day Smoker -- 0.25 packs/day  . Smokeless tobacco: Not on file  . Alcohol Use: No  . Drug Use: No  . Sexual Activity: Not on file   Other Topics Concern  . Not on file   Social History Narrative   Review of Systems  Constitutional: Negative.   HENT: Negative.   Eyes: Negative for photophobia.  Respiratory: Negative for shortness of breath and stridor.   Cardiovascular: Negative.   Gastrointestinal: Negative.   Endocrine: Negative for polydipsia, polyphagia and polyuria.  Genitourinary: Positive for vaginal discharge (vaginal itching). Negative for dysuria, urgency, vaginal bleeding and difficulty urinating.  Musculoskeletal: Negative.   Skin: Positive for wound (Sutures to right forearm). Negative for rash.  Allergic/Immunologic: Positive for immunocompromised state (HIV +).  Neurological: Negative for tremors, syncope and weakness.   Hematological: Negative.   Psychiatric/Behavioral: Negative for self-injury and decreased concentration.       Stress is primary trigger for anxiety. History of bipolar depression. She was previously on Seraquel and Xanax. Currently denies suicidal or homicidal ideations.         Objective:   Physical Exam  Constitutional: Vital signs are normal. She appears well-developed.  HENT:  Head: Normocephalic and atraumatic.  Right Ear: Hearing, external ear and ear canal normal. Tympanic membrane is scarred.  Left Ear: Hearing, external ear and ear canal normal. Tympanic membrane is scarred.  Nose: Nose normal.  Mouth/Throat: Oropharynx is clear and moist and mucous membranes are normal. Abnormal dentition (partially edentulous). Dental caries present.  Eyes: Conjunctivae, EOM and lids are normal. Pupils are equal, round, and reactive to light.  Neck: Trachea normal.  Cardiovascular: Normal rate, regular rhythm and normal pulses.   Pulmonary/Chest: Effort normal and breath sounds normal.  Abdominal: Soft. Normal appearance. There is no tenderness.  Genitourinary: Uterus is deviated. Uterus is not enlarged and not tender. Cervix exhibits discharge (thick, white discharge). Cervix exhibits no motion tenderness. Right adnexum displays no mass and no tenderness. Left adnexum displays no mass and no tenderness. No erythema or tenderness in the vagina. No signs of injury around the vagina. Vaginal discharge found.  Lymphadenopathy:       Head (right side): No submental and no submandibular adenopathy present.       Head (left side): No submental and no submandibular adenopathy present.  Neurological: She is alert. No cranial nerve deficit or  sensory deficit. Gait abnormal.      BP 127/82 mmHg  Pulse 79  Temp(Src) 98.2 F (36.8 C) (Oral)  Resp 16  Ht  (1.6 m)  Wt 137 lb (62.143 kg)  BMI 24.27 kg/m2  LMP 11/16/2015 Assessment & Plan:    1. Encounter for routine gynecological  examination Will notify patient as results become available.  - Cytology - PAP [place]  2. Vaginal discharge  5-7 clue cells per hight power field.  - POCT wet + KOH prep  3. Vaginal itching  fluconazole (DIFLUCAN) 150 MG tablet; Take 1 tablet (150 mg total) by mouth once.  Dispense: 1 tablet; Refill: 0 - POCT wet + KOH prep  4. Bacterial vaginosis - metroNIDAZOLE (FLAGYL) 500 MG tablet; Take 1 tablet (500 mg total) by mouth 2 (two) times daily.  Dispense: 14 tablet; Refill: 0 - fluconazole (DIFLUCAN) 150 MG tablet; Take 1 tablet (150 mg total) by mouth once.  Dispense: 1 tablet; Refill: 0  5. HIV-1 associated autonomic neuropathy (HCC) Patient has a history of HIV. Reviewed recent laboratory values. Patient is scheduled with Dr. Ninetta Lights. Reminded patient of the importance of keeping appointment, she expressed understanding. Continue Gabapentin as previously prescribed.    6. Tobacco use disorder Smoking cessation instruction/counseling given:  counseled patient on the dangers of tobacco use, advised patient to stop smoking, and reviewed strategies to maximize success    RTC: 1 month The patient was given clear instructions to go to ER or return to medical center if symptoms do not improve, worsen or new problems develop. The patient verbalized understanding. Will notify patient with laboratory results. Massie Maroon, FNP

## 2015-11-27 LAB — CYTOLOGY - PAP

## 2015-11-28 ENCOUNTER — Telehealth: Payer: Self-pay | Admitting: Family Medicine

## 2015-11-28 DIAGNOSIS — R87619 Unspecified abnormal cytological findings in specimens from cervix uteri: Secondary | ICD-10-CM | POA: Insufficient documentation

## 2015-11-28 DIAGNOSIS — R87612 Low grade squamous intraepithelial lesion on cytologic smear of cervix (LGSIL): Secondary | ICD-10-CM

## 2015-11-28 NOTE — Telephone Encounter (Signed)
Left message for patient to call back regarding labs. Thanks!  

## 2015-11-28 NOTE — Telephone Encounter (Signed)
Reviewed pap smear results. Will send referral to gynecologist for further evaluation.    Massie MaroonHollis,Crystalyn Delia M, FNP

## 2015-11-29 NOTE — Telephone Encounter (Signed)
Called and left message for patient to call back. Thanks!  

## 2015-11-30 NOTE — Telephone Encounter (Signed)
Called, no answer. Will try later. Thanks!  

## 2015-12-01 ENCOUNTER — Telehealth: Payer: Self-pay

## 2015-12-01 NOTE — Telephone Encounter (Signed)
Called patient, no answer. Left message advising patient that she would be referred for her last pap results and if she had any questions to call back to our office. Thanks!

## 2015-12-06 ENCOUNTER — Encounter: Payer: Self-pay | Admitting: Infectious Diseases

## 2015-12-06 ENCOUNTER — Telehealth: Payer: Self-pay | Admitting: Family Medicine

## 2015-12-06 ENCOUNTER — Ambulatory Visit (INDEPENDENT_AMBULATORY_CARE_PROVIDER_SITE_OTHER): Payer: Medicaid Other | Admitting: Infectious Diseases

## 2015-12-06 VITALS — BP 125/79 | HR 74 | Temp 98.8°F | Wt 139.0 lb

## 2015-12-06 DIAGNOSIS — G629 Polyneuropathy, unspecified: Secondary | ICD-10-CM

## 2015-12-06 DIAGNOSIS — Z23 Encounter for immunization: Secondary | ICD-10-CM

## 2015-12-06 DIAGNOSIS — H538 Other visual disturbances: Secondary | ICD-10-CM | POA: Diagnosis not present

## 2015-12-06 DIAGNOSIS — R87612 Low grade squamous intraepithelial lesion on cytologic smear of cervix (LGSIL): Secondary | ICD-10-CM

## 2015-12-06 DIAGNOSIS — B20 Human immunodeficiency virus [HIV] disease: Secondary | ICD-10-CM

## 2015-12-06 NOTE — Assessment & Plan Note (Signed)
Will have her seen by ophhto

## 2015-12-06 NOTE — Assessment & Plan Note (Signed)
Will recheck her labs today Given condoms swears adherence Needs pnvx rtc in 3 months Will get husband into prep (does not qualify for study)

## 2015-12-06 NOTE — Progress Notes (Signed)
   Subjective:    Patient ID: Meagan Mcclure, female    DOB: [birth year], [age] y.o.   MRN: [id number]  HPI 44 yo F with hx of HIV+ since 1995.  Was on TRV/ATVr then was changed to stribiild--> genvoya. Wt is up 10# since back on marinol.  Still having blurry vision. Had not been to ophtho.  Had abn PAP 11-27-15 CIN1- [facility] to [facility].   HIV 1 RNA QUANT (copies/mL)  Date Value  10/19/2015 48188*   CD4 T CELL ABS (/uL)  Date Value  10/19/2015 60*    Today c/o back and leg pain.   Here with husband, HIV (-). 3 HIV (-) kids.    Review of Systems  Constitutional: Negative for appetite change and unexpected weight change.  Eyes: Positive for visual disturbance.  Respiratory: Negative for shortness of breath.   Gastrointestinal: Negative for diarrhea and constipation.  Genitourinary: Negative for difficulty urinating and menstrual problem.  Musculoskeletal: Positive for back pain.  Neurological: Negative for headaches.       Objective:   Physical Exam  Constitutional: She appears well-developed and well-nourished.  HENT:  Mouth/Throat: No oropharyngeal exudate.  Eyes: EOM are normal. Pupils are equal, round, and reactive to light.  Neck: Neck supple.  Cardiovascular: Normal rate, regular rhythm and normal heart sounds.   Pulmonary/Chest: Effort normal and breath sounds normal.  Abdominal: Soft. Bowel sounds are normal. There is no tenderness. There is no rebound.  Lymphadenopathy:    She has no cervical adenopathy.      Assessment & Plan:

## 2015-12-06 NOTE — Assessment & Plan Note (Signed)
Has f[facility]

## 2015-12-06 NOTE — Addendum Note (Signed)
Addended by: Wendall MolaOCKERHAM, JACQUELINE A on: 12/06/2015 04:45 PM   Modules accepted: Orders

## 2015-12-06 NOTE — Telephone Encounter (Signed)
Pt. States NP prescribed her oxycodone.Marland Kitchen.Marland Kitchen.Marland Kitchen.Marland Kitchen.patient stated [facility] needs a paper script for this prescription please contact patient...Marland Kitchen..Marland Kitchen

## 2015-12-06 NOTE — Assessment & Plan Note (Signed)
Her pcp will manage this and her pain rx.

## 2015-12-07 ENCOUNTER — Telehealth: Payer: Self-pay | Admitting: *Deleted

## 2015-12-07 NOTE — Telephone Encounter (Signed)
Called patient to notify her of appt with [facility] for 12/12/15 at 2:40 pm. [address] [place]. Left voice mail and asked her to return my call. Wendall MolaJacqueline Storm Mcclure

## 2015-12-07 NOTE — Telephone Encounter (Signed)
Nurse called patient, reached voicemail. Left message for patient to call Fahed Morten with [facility], at [phone]. Nurse called to ask patient about need for paper prescription. Patient seen by Scot Juniffany Noel on 10/06/15.

## 2015-12-08 LAB — HIV-1 RNA ULTRAQUANT REFLEX TO GENTYP+
HIV 1 RNA Quant: 29 copies/mL — ABNORMAL HIGH (ref <20)
HIV-1 RNA Quant, Log: 1.46 Log copies/mL — ABNORMAL HIGH (ref <1.30)

## 2015-12-08 LAB — T-HELPER CELL (CD4) - (RCID CLINIC ONLY)
CD4 % Helper T Cell: 9 % — ABNORMAL LOW (ref 33–55)
CD4 T CELL ABS: 110 /uL — AB (ref 400–2700)

## 2015-12-08 NOTE — Telephone Encounter (Signed)
Nurse called patient, reached voicemail. Left message for patient to call Corion Sherrod with [facility], at [phone].  

## 2015-12-26 LAB — HIV-1 INTEGRASE GENOTYPE

## 2015-12-27 ENCOUNTER — Encounter: Payer: Medicaid Other | Admitting: Obstetrics

## 2016-02-01 ENCOUNTER — Other Ambulatory Visit: Payer: Self-pay | Admitting: *Deleted

## 2016-02-01 DIAGNOSIS — B20 Human immunodeficiency virus [HIV] disease: Secondary | ICD-10-CM

## 2016-02-01 MED ORDER — DRONABINOL 10 MG PO CAPS: 10.0000 mg | ORAL_CAPSULE | Freq: Two times a day (BID) | ORAL | Status: AC

## 2016-02-02 ENCOUNTER — Telehealth: Payer: Self-pay | Admitting: *Deleted

## 2016-02-02 NOTE — Telephone Encounter (Signed)
MA spoke with Pharmacy and [place] [facility] to authorize patients Seroquel refill Safety Authorization. APPROVAL #: [id number] MA contacted the pharmacy as well with approval.

## 2016-02-26 ENCOUNTER — Telehealth: Payer: Self-pay | Admitting: *Deleted

## 2016-02-26 NOTE — Telephone Encounter (Signed)
Patient called requesting that her labs be faxed to a housing agency in [place]. We did receive a signed release and she stated she is not moving, however she is trying to obtain housing. Unfortunately the form asked for information and a signature from an MD. Advised patient I could send her last labs, but not the medical form. She would need a visit for that to be filled out. Labs faxed to [facility], in [place], [place] #[phone]. Phone #[phone].

## 2016-03-20 ENCOUNTER — Other Ambulatory Visit: Payer: Medicaid Other

## 2016-04-03 ENCOUNTER — Ambulatory Visit: Payer: Medicaid Other | Admitting: Infectious Diseases

## 2016-12-11 IMAGING — CT CT HEAD W/O CM
2 series · 15 of 30 positions shown, 19 images · non-contrast
Comparison: None.

CLINICAL DATA: Left-sided headache. Left-sided vision loss. HIV
disease. History of prior infarct.

EXAM:
CT HEAD WITHOUT CONTRAST
TECHNIQUE: Contiguous axial images were obtained from the base of the skull
through the vertex without intravenous contrast.

[Series 201: head w/o, idose (1) · axial · non-contrast · 0.49mm/px · z∈[+90,+220]mm · 13 of 32 slices shown, 17 images]
[im 3/32  brain]
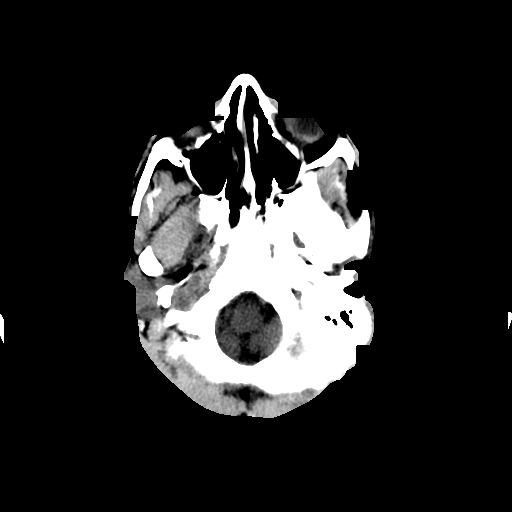
[im 3/32  bone]
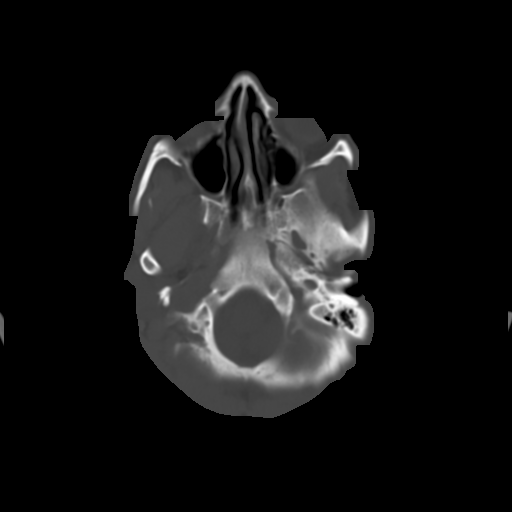
[im 5/32  brain]
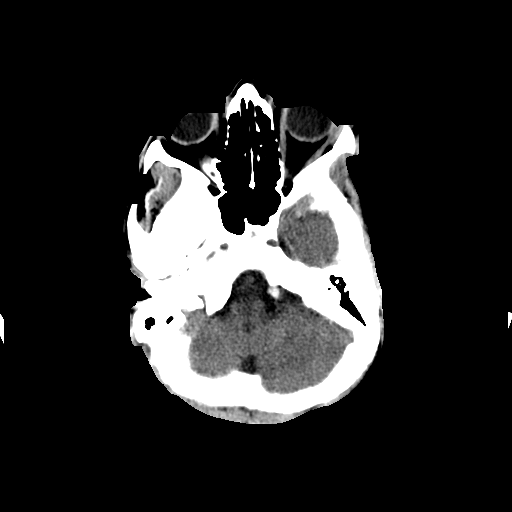
[im 7/32  brain]
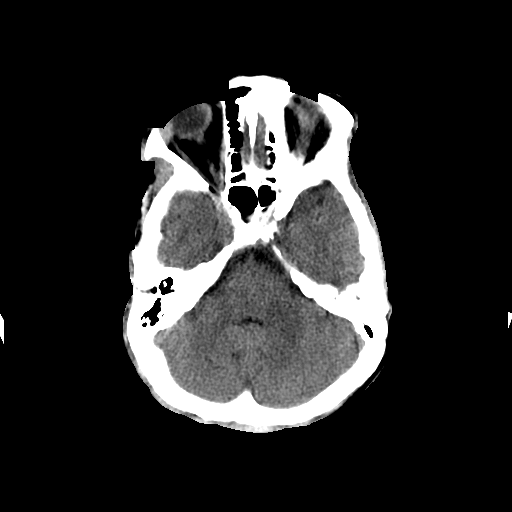
[im 9/32  brain]
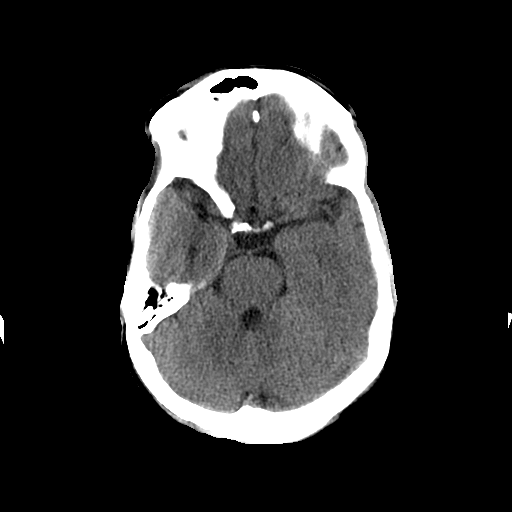
[im 12/32  brain]
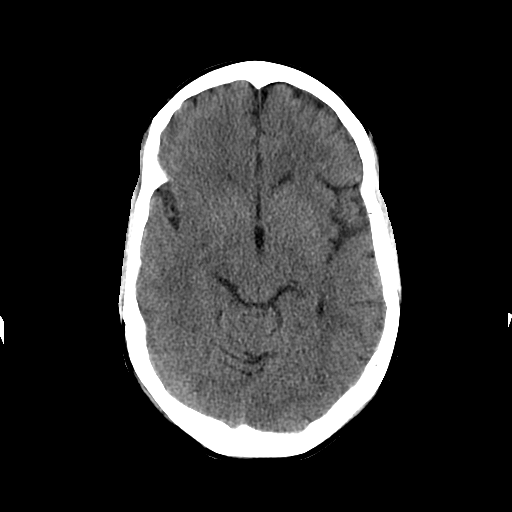
[im 12/32  bone]
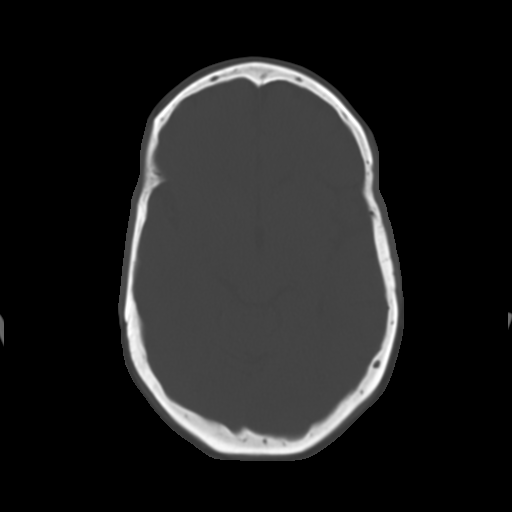
[im 14/32  brain]
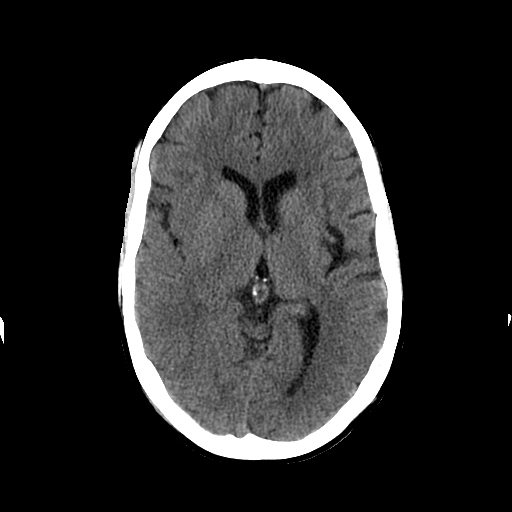
[im 16/32  brain]
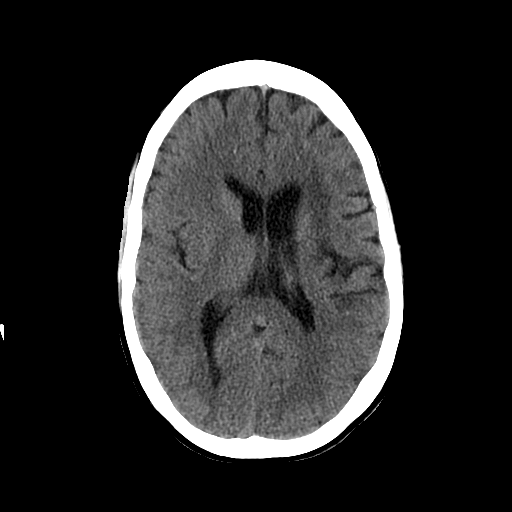
[im 18/32  brain]
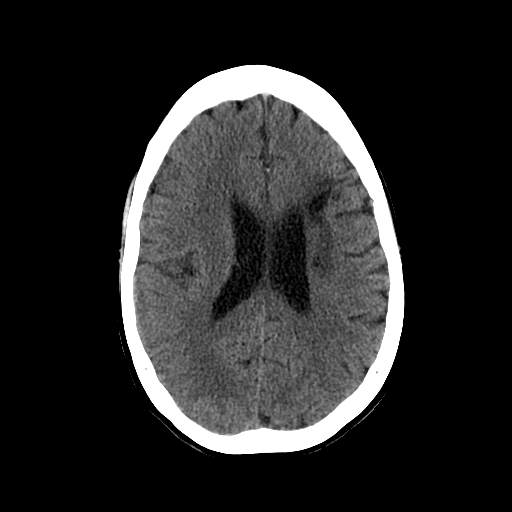
[im 20/32  brain]
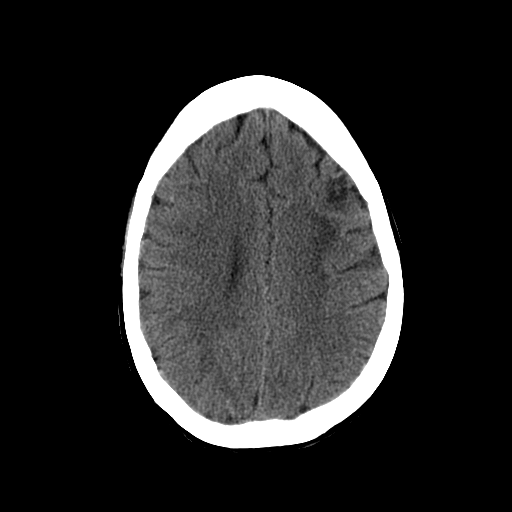
[im 20/32  bone]
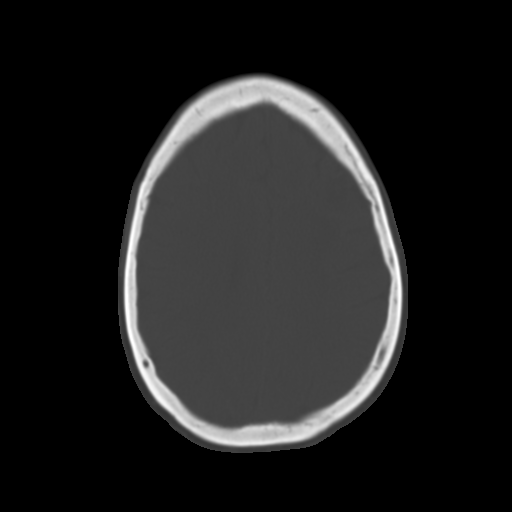
[im 23/32  brain]
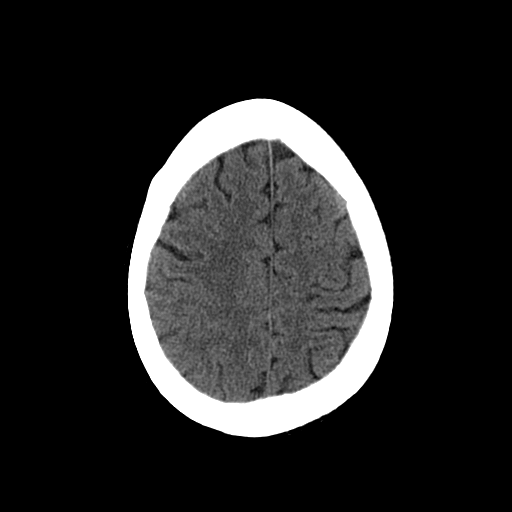
[im 25/32  brain]
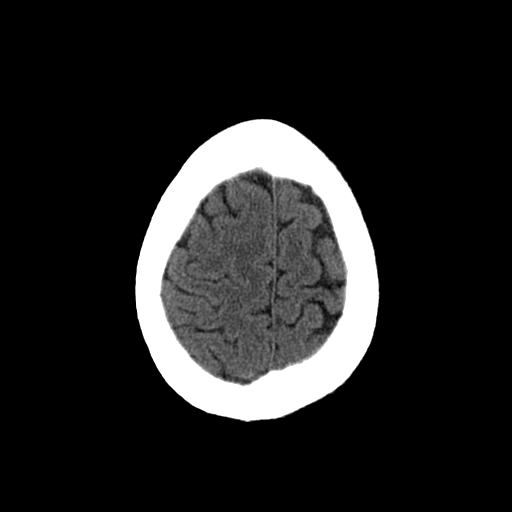
[im 27/32  brain]
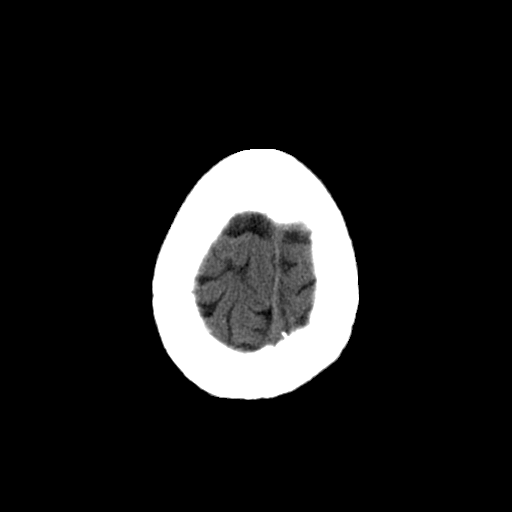
[im 29/32  brain]
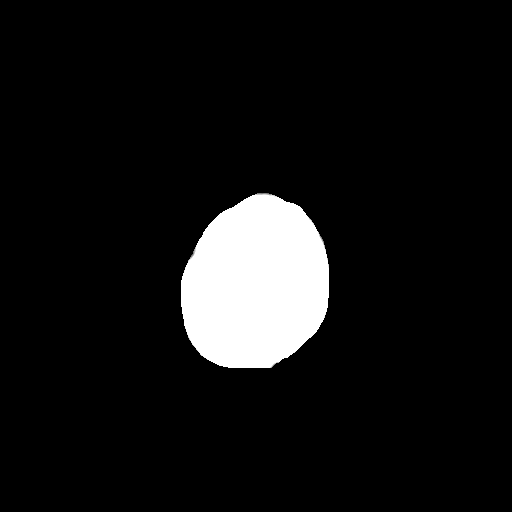
[im 29/32  bone]
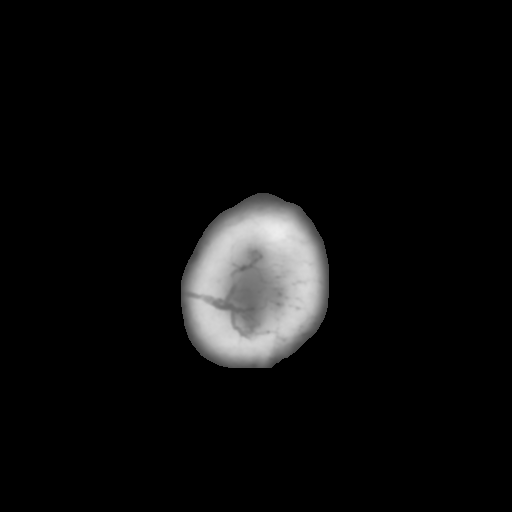

[Series 202: head w/o bone, idose (1) · axial · non-contrast · 0.49mm/px · z∈[+90,+110]mm · 2 of 32 slices shown]
[im 3/32  bone]
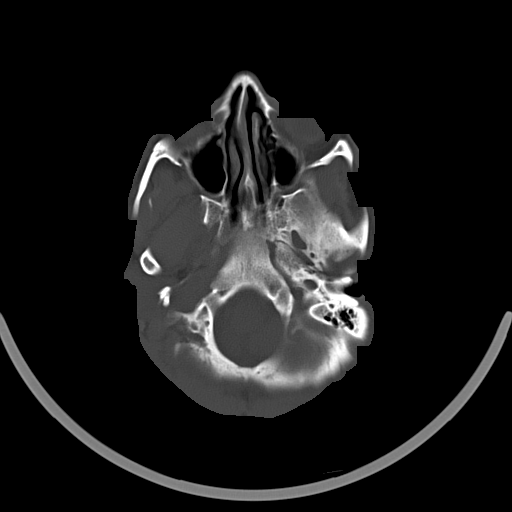
[im 7/32  bone]
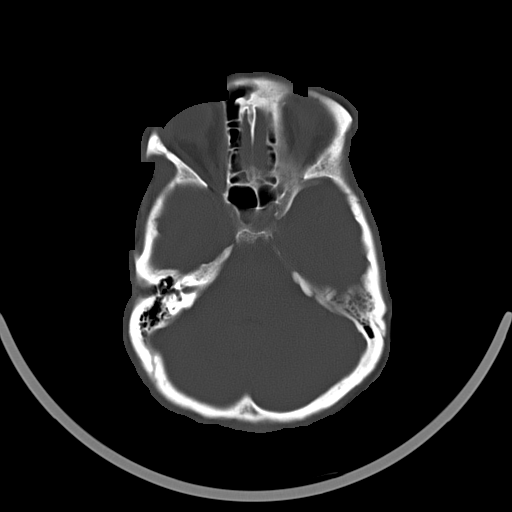

[15 of 30 positions shown; findings below may reference images not displayed]

FINDINGS: The ventricles are normal in size and configuration. There is no
intracranial mass, hemorrhage, extra-axial fluid collection, or
midline shift. There is evidence of a prior infarct in the posterior
mid left frontal lobe. There is evidence of age uncertain but
probably nonacute infarction in the anterior to mid left centrum
semiovale. There is a small focus of decreased attenuation in the
posterior superior left cerebellum seen on axial slice 10 series
201, consistent with a small age uncertain infarct. Elsewhere the
gray-white compartments appear normal. The bony calvarium appears
intact. The mastoid air cells are clear. There is rightward
deviation of the nasal septum. No intraorbital lesions are
demonstrable.
IMPRESSION: Age uncertain but probably nonacute appearing infarcts in the
anterior to mid left centrum semiovale as well as in the posterior
mid left frontal lobe region. Age uncertain small infarct posterior
aspect of the superior left frontal lobe seen on axial slice 10
series 601. No clearly acute infarct is evident. No hemorrhage or
mass effect.

If there remains concern for potential acute infarct, brain MR could
be helpful to further assess in this regard.

## 2016-12-19 IMAGING — MR MR LUMBAR SPINE WO/W CM
4 of 8 series · 19 of 48 positions shown · IV contrast (Y     MULTI)
Comparison: None.

CLINICAL DATA: Bilateral lower extremity weakness for the past
couple of days. AIDS.

EXAM:
MRI LUMBAR SPINE WITHOUT AND WITH CONTRAST
TECHNIQUE: Multiplanar and multiecho pulse sequences of the lumbar spine were
obtained without and with intravenous contrast.
CONTRAST:  12 ML MULTIHANCE

[Series 3: T2 · sagittal · 4.0mm · 0.55mm/px · 3 of 13 slices shown (1 of 2)]
[im 1/13]
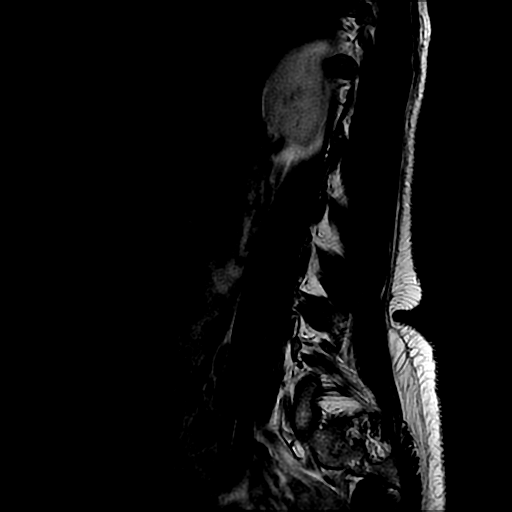
[im 7/13]
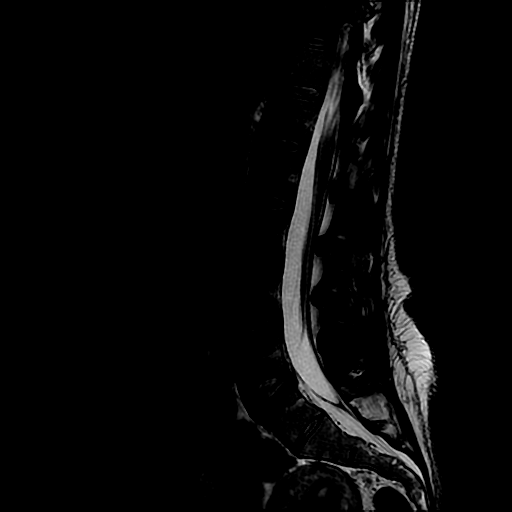
[im 13/13]
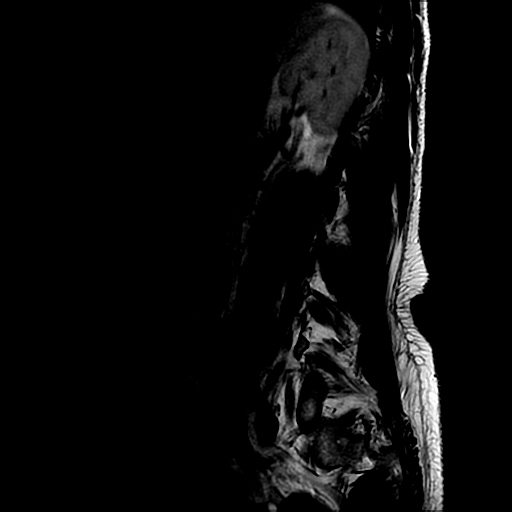

[Series 4: T1 · sagittal · 4.0mm · 0.55mm/px · 3 of 13 slices shown (1 of 2)]
[im 1/13]
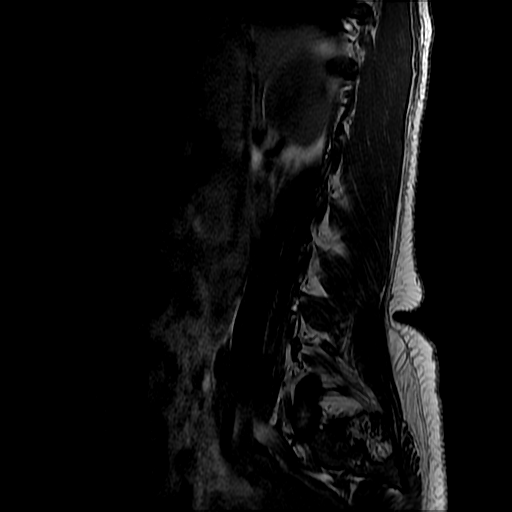
[im 7/13]
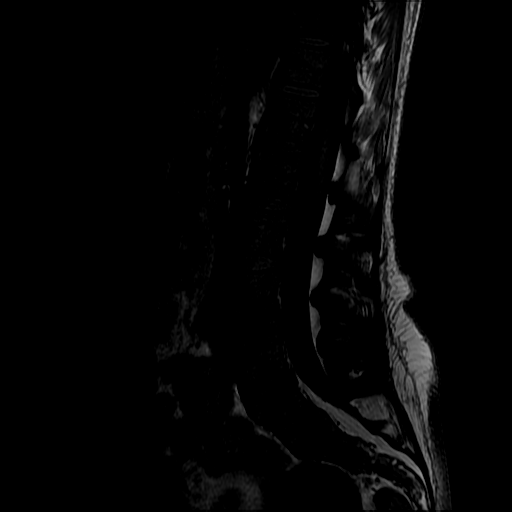
[im 13/13]
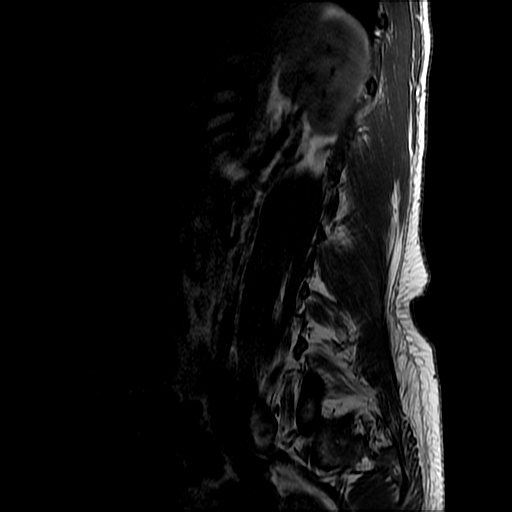

[Series 6: T2 · axial · 4.0mm · 0.39mm/px · z∈[-166,+38]mm · 9 of 38 slices shown (2 of 2)]
[im 1/38]
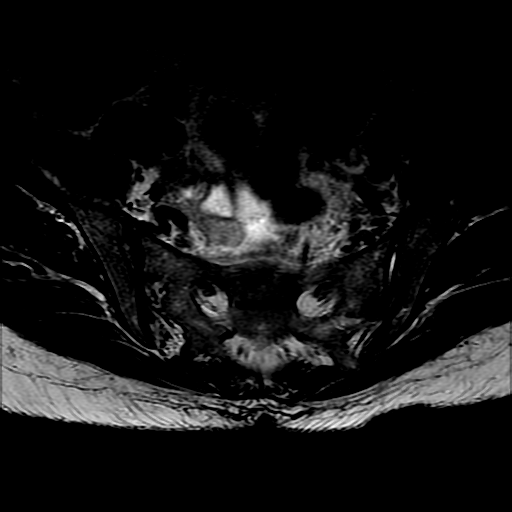
[im 5/38]
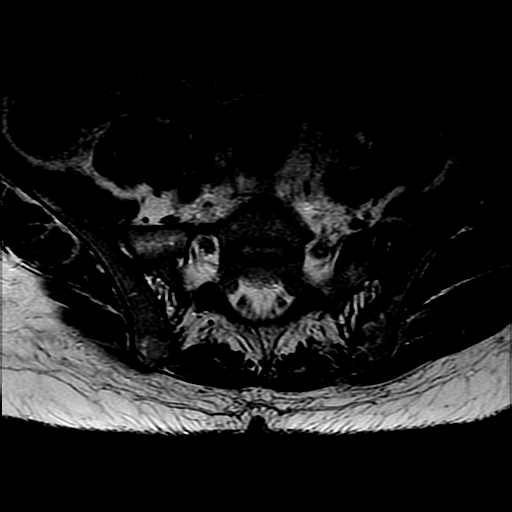
[im 10/38]
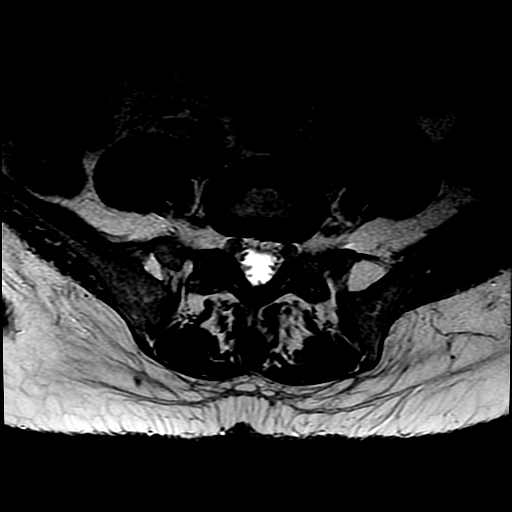
[im 14/38]
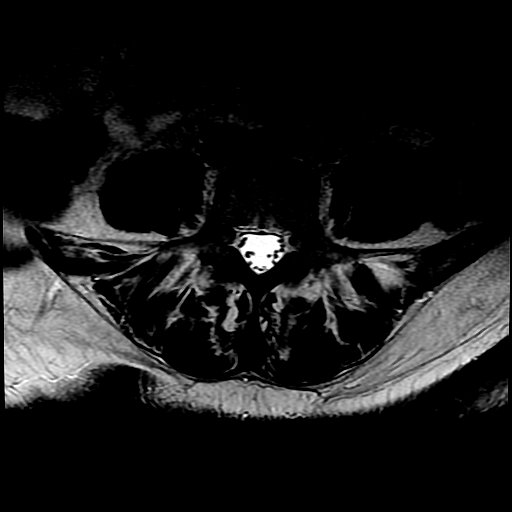
[im 19/38]
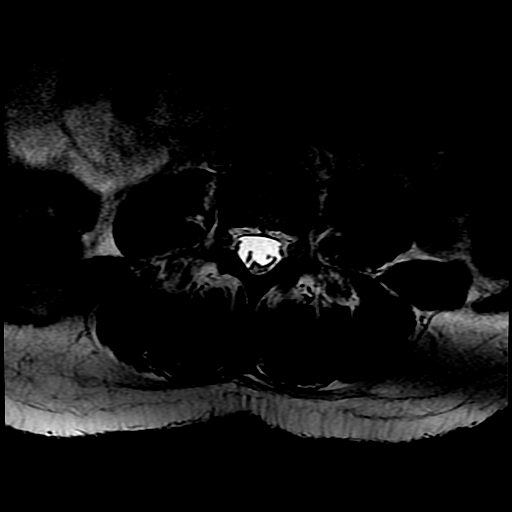
[im 24/38]
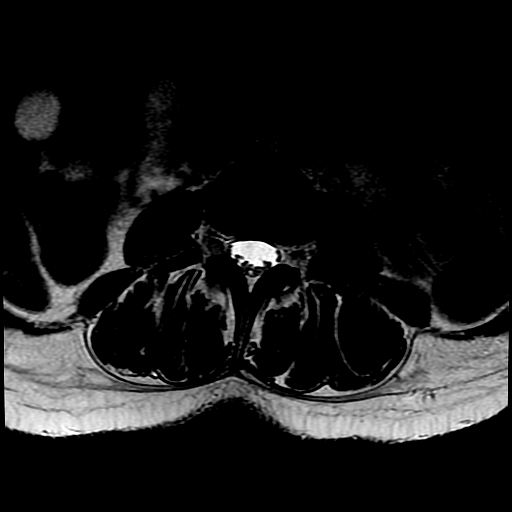
[im 28/38]
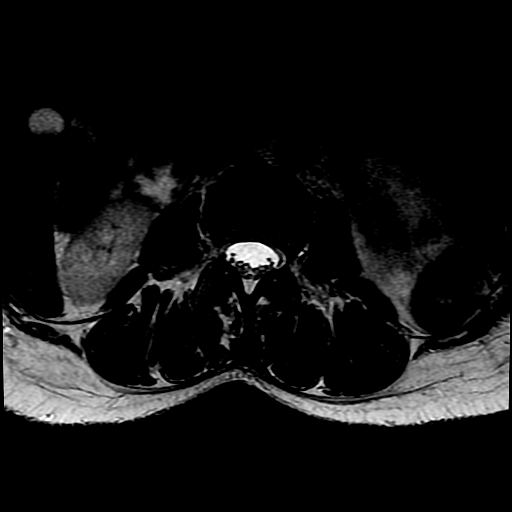
[im 33/38]
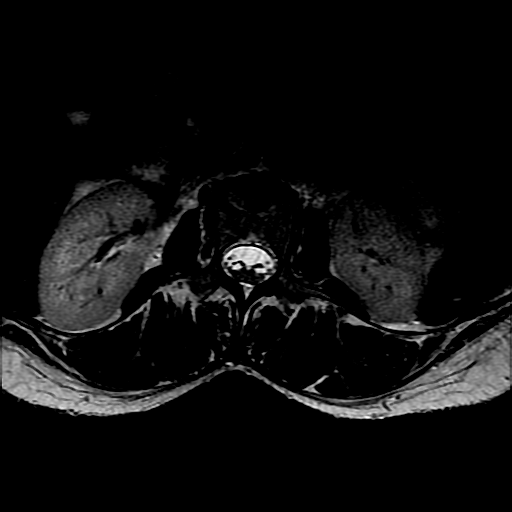
[im 38/38]
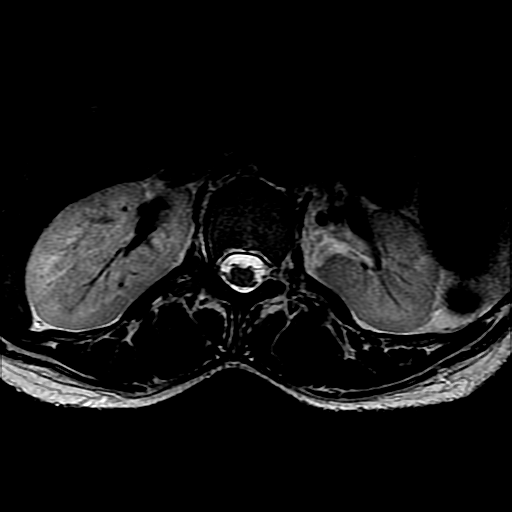

[Series 7: T1 · axial · 4.0mm · 0.39mm/px · z∈[-166,+13]mm · 4 of 38 slices shown (2 of 2)]
[im 1/38]
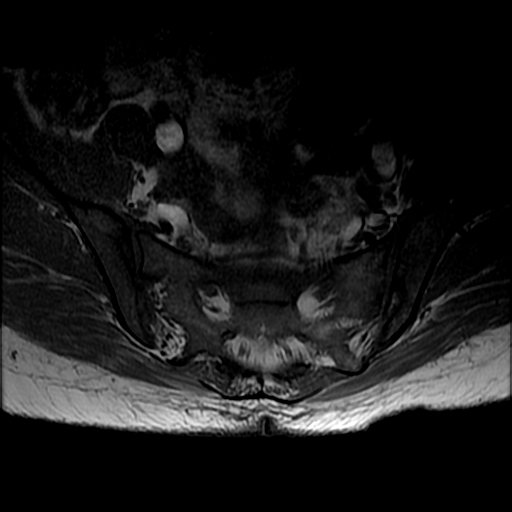
[im 5/38]
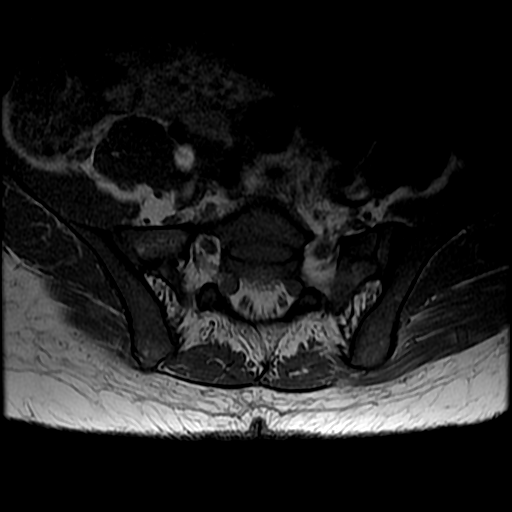
[im 19/38]
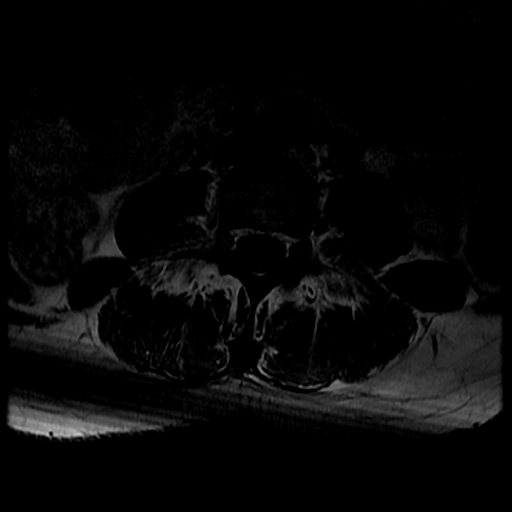
[im 33/38]
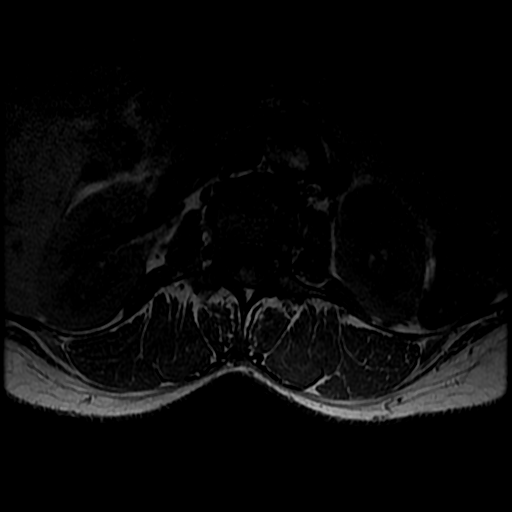

[19 of 48 positions shown; findings below may reference images not displayed]

FINDINGS: Normal signal is present in the conus medullaris which terminates at
L2-3, the lower limits of normal. Small stones or sludge are present
in the gallbladder. Limited imaging of the abdomen is otherwise
unremarkable. There is no significant adenopathy.

Marrow signal is somewhat depressed. Vertebral body heights
alignment are maintained.

No significant focal disc protrusion or stenosis is present.

The postcontrast images demonstrate no pathologic enhancement.
IMPRESSION: 1. No focal disc protrusion or stenosis to explain the patient's
weakness.
2. Gallstones or sludge.
